# Patient Record
Sex: Female | Born: 1960 | Race: White | Hispanic: No | Smoking: Never smoker
Health system: Southern US, Community
[De-identification: ages and names within clinical notes are randomized; demographics above are authoritative.]

## PROBLEM LIST (undated history)

## (undated) DIAGNOSIS — E049 Nontoxic goiter, unspecified: Secondary | ICD-10-CM

## (undated) DIAGNOSIS — C801 Malignant (primary) neoplasm, unspecified: Secondary | ICD-10-CM

## (undated) DIAGNOSIS — E785 Hyperlipidemia, unspecified: Secondary | ICD-10-CM

## (undated) DIAGNOSIS — I10 Essential (primary) hypertension: Secondary | ICD-10-CM

## (undated) HISTORY — PX: FOOT SURGERY: SHX648

## (undated) HISTORY — DX: Malignant (primary) neoplasm, unspecified: C80.1

## (undated) HISTORY — DX: Nontoxic goiter, unspecified: E04.9

## (undated) HISTORY — DX: Hyperlipidemia, unspecified: E78.5

## (undated) HISTORY — PX: KNEE SURGERY: SHX244

## (undated) HISTORY — PX: DG C-ARM 2 VIEW RIGHT ANKLE (ARMC HX): HXRAD1458

## (undated) HISTORY — PX: ABDOMINAL HYSTERECTOMY: SHX81

## (undated) HISTORY — PX: LIPOMA RESECTION: SHX23

## (undated) HISTORY — DX: Essential (primary) hypertension: I10

## (undated) HISTORY — PX: OTHER SURGICAL HISTORY: SHX169

## (undated) HISTORY — PX: TOTAL ABDOMINAL HYSTERECTOMY: SHX209

---

## 1998-03-26 ENCOUNTER — Ambulatory Visit (HOSPITAL_COMMUNITY): Admission: RE | Admit: 1998-03-26 | Discharge: 1998-03-26 | Payer: Self-pay | Admitting: Obstetrics and Gynecology

## 1998-07-08 ENCOUNTER — Ambulatory Visit (HOSPITAL_COMMUNITY): Admission: RE | Admit: 1998-07-08 | Discharge: 1998-07-08 | Payer: Self-pay | Admitting: Orthopedic Surgery

## 1998-07-08 ENCOUNTER — Encounter: Payer: Self-pay | Admitting: Orthopedic Surgery

## 1998-08-13 ENCOUNTER — Encounter: Admission: RE | Admit: 1998-08-13 | Discharge: 1998-09-07 | Payer: Self-pay | Admitting: Orthopedic Surgery

## 1999-04-07 ENCOUNTER — Encounter: Payer: Self-pay | Admitting: Obstetrics and Gynecology

## 1999-04-07 ENCOUNTER — Ambulatory Visit (HOSPITAL_COMMUNITY): Admission: RE | Admit: 1999-04-07 | Discharge: 1999-04-07 | Payer: Self-pay | Admitting: Obstetrics and Gynecology

## 2000-05-07 ENCOUNTER — Encounter: Payer: Self-pay | Admitting: Obstetrics and Gynecology

## 2000-05-07 ENCOUNTER — Ambulatory Visit (HOSPITAL_COMMUNITY): Admission: RE | Admit: 2000-05-07 | Discharge: 2000-05-07 | Payer: Self-pay | Admitting: Obstetrics and Gynecology

## 2001-05-14 ENCOUNTER — Ambulatory Visit (HOSPITAL_COMMUNITY): Admission: RE | Admit: 2001-05-14 | Discharge: 2001-05-14 | Payer: Self-pay | Admitting: Family Medicine

## 2001-05-14 ENCOUNTER — Encounter: Payer: Self-pay | Admitting: Obstetrics and Gynecology

## 2001-08-02 ENCOUNTER — Encounter: Admission: RE | Admit: 2001-08-02 | Discharge: 2001-08-02 | Payer: Self-pay | Admitting: Family Medicine

## 2001-08-02 ENCOUNTER — Encounter: Payer: Self-pay | Admitting: Family Medicine

## 2001-08-20 ENCOUNTER — Encounter: Payer: Self-pay | Admitting: Family Medicine

## 2001-08-20 ENCOUNTER — Ambulatory Visit (HOSPITAL_COMMUNITY): Admission: RE | Admit: 2001-08-20 | Discharge: 2001-08-20 | Payer: Self-pay | Admitting: Family Medicine

## 2001-09-02 ENCOUNTER — Ambulatory Visit (HOSPITAL_COMMUNITY): Admission: RE | Admit: 2001-09-02 | Discharge: 2001-09-02 | Payer: Self-pay | Admitting: Internal Medicine

## 2001-09-02 ENCOUNTER — Encounter (INDEPENDENT_AMBULATORY_CARE_PROVIDER_SITE_OTHER): Payer: Self-pay | Admitting: Specialist

## 2001-09-02 ENCOUNTER — Encounter: Payer: Self-pay | Admitting: Internal Medicine

## 2001-09-03 ENCOUNTER — Ambulatory Visit (HOSPITAL_COMMUNITY): Admission: RE | Admit: 2001-09-03 | Discharge: 2001-09-03 | Payer: Self-pay | Admitting: Diagnostic Radiology

## 2001-09-03 ENCOUNTER — Encounter: Payer: Self-pay | Admitting: Internal Medicine

## 2001-09-11 ENCOUNTER — Ambulatory Visit (HOSPITAL_COMMUNITY): Admission: RE | Admit: 2001-09-11 | Discharge: 2001-09-11 | Payer: Self-pay | Admitting: Internal Medicine

## 2001-09-11 ENCOUNTER — Encounter: Payer: Self-pay | Admitting: Internal Medicine

## 2004-04-01 ENCOUNTER — Encounter: Payer: Self-pay | Admitting: Family Medicine

## 2004-04-01 LAB — CONVERTED CEMR LAB: Triglycerides: 51 mg/dL

## 2004-09-22 ENCOUNTER — Ambulatory Visit: Payer: Self-pay | Admitting: Internal Medicine

## 2004-10-21 ENCOUNTER — Ambulatory Visit: Payer: Self-pay | Admitting: Pulmonary Disease

## 2004-10-28 ENCOUNTER — Ambulatory Visit: Payer: Self-pay | Admitting: Internal Medicine

## 2004-11-04 ENCOUNTER — Ambulatory Visit: Payer: Self-pay | Admitting: Internal Medicine

## 2004-11-16 ENCOUNTER — Ambulatory Visit (HOSPITAL_COMMUNITY): Admission: RE | Admit: 2004-11-16 | Discharge: 2004-11-16 | Payer: Self-pay | Admitting: Internal Medicine

## 2005-01-05 ENCOUNTER — Encounter: Admission: RE | Admit: 2005-01-05 | Discharge: 2005-04-05 | Payer: Self-pay | Admitting: Family Medicine

## 2006-04-23 ENCOUNTER — Emergency Department (HOSPITAL_COMMUNITY): Admission: EM | Admit: 2006-04-23 | Discharge: 2006-04-23 | Payer: Self-pay | Admitting: Emergency Medicine

## 2006-05-02 ENCOUNTER — Ambulatory Visit: Payer: Self-pay | Admitting: Family Medicine

## 2006-05-08 DIAGNOSIS — F325 Major depressive disorder, single episode, in full remission: Secondary | ICD-10-CM | POA: Insufficient documentation

## 2006-07-06 ENCOUNTER — Encounter: Payer: Self-pay | Admitting: Family Medicine

## 2006-07-06 DIAGNOSIS — J309 Allergic rhinitis, unspecified: Secondary | ICD-10-CM | POA: Insufficient documentation

## 2006-07-06 DIAGNOSIS — G43909 Migraine, unspecified, not intractable, without status migrainosus: Secondary | ICD-10-CM | POA: Insufficient documentation

## 2006-11-20 ENCOUNTER — Encounter (INDEPENDENT_AMBULATORY_CARE_PROVIDER_SITE_OTHER): Payer: Self-pay | Admitting: Family Medicine

## 2007-01-08 ENCOUNTER — Ambulatory Visit: Payer: Self-pay | Admitting: Family Medicine

## 2007-01-08 DIAGNOSIS — J45909 Unspecified asthma, uncomplicated: Secondary | ICD-10-CM

## 2007-01-08 DIAGNOSIS — J4520 Mild intermittent asthma, uncomplicated: Secondary | ICD-10-CM | POA: Insufficient documentation

## 2007-01-08 DIAGNOSIS — I10 Essential (primary) hypertension: Secondary | ICD-10-CM | POA: Insufficient documentation

## 2007-01-09 ENCOUNTER — Encounter: Payer: Self-pay | Admitting: Family Medicine

## 2007-01-09 LAB — CONVERTED CEMR LAB
ALT: 23 units/L (ref 0–35)
CO2: 25 meq/L (ref 19–32)
Chloride: 105 meq/L (ref 96–112)
Cholesterol, target level: 200 mg/dL
Cholesterol: 213 mg/dL — ABNORMAL HIGH (ref 0–200)
LDL Goal: 160 mg/dL
Sodium: 143 meq/L (ref 135–145)
Total Bilirubin: 0.4 mg/dL (ref 0.3–1.2)
Total Protein: 7 g/dL (ref 6.0–8.3)
VLDL: 19 mg/dL (ref 0–40)

## 2007-02-19 ENCOUNTER — Ambulatory Visit: Payer: Self-pay | Admitting: Family Medicine

## 2007-03-28 ENCOUNTER — Ambulatory Visit: Payer: Self-pay | Admitting: Family Medicine

## 2007-05-27 ENCOUNTER — Ambulatory Visit: Payer: Self-pay | Admitting: Family Medicine

## 2007-05-27 DIAGNOSIS — D239 Other benign neoplasm of skin, unspecified: Secondary | ICD-10-CM | POA: Insufficient documentation

## 2007-06-22 ENCOUNTER — Encounter: Payer: Self-pay | Admitting: Family Medicine

## 2007-09-12 ENCOUNTER — Telehealth: Payer: Self-pay | Admitting: Family Medicine

## 2007-09-26 ENCOUNTER — Ambulatory Visit: Payer: Self-pay | Admitting: Family Medicine

## 2007-09-26 DIAGNOSIS — R059 Cough, unspecified: Secondary | ICD-10-CM | POA: Insufficient documentation

## 2007-09-26 DIAGNOSIS — R05 Cough: Secondary | ICD-10-CM

## 2007-10-10 ENCOUNTER — Telehealth: Payer: Self-pay | Admitting: Family Medicine

## 2007-11-19 ENCOUNTER — Encounter: Payer: Self-pay | Admitting: Family Medicine

## 2008-01-27 ENCOUNTER — Telehealth: Payer: Self-pay | Admitting: Family Medicine

## 2008-02-03 ENCOUNTER — Ambulatory Visit: Payer: Self-pay | Admitting: Family Medicine

## 2008-02-03 DIAGNOSIS — K449 Diaphragmatic hernia without obstruction or gangrene: Secondary | ICD-10-CM | POA: Insufficient documentation

## 2008-02-10 ENCOUNTER — Telehealth: Payer: Self-pay | Admitting: Family Medicine

## 2008-06-02 ENCOUNTER — Ambulatory Visit: Payer: Self-pay | Admitting: Family Medicine

## 2008-06-02 DIAGNOSIS — M79609 Pain in unspecified limb: Secondary | ICD-10-CM | POA: Insufficient documentation

## 2008-06-02 DIAGNOSIS — N951 Menopausal and female climacteric states: Secondary | ICD-10-CM | POA: Insufficient documentation

## 2008-06-02 LAB — CONVERTED CEMR LAB: Blood Glucose, Fasting: 96 mg/dL

## 2008-09-24 ENCOUNTER — Encounter: Payer: Self-pay | Admitting: Family Medicine

## 2008-10-26 ENCOUNTER — Ambulatory Visit: Payer: Self-pay | Admitting: Family Medicine

## 2008-10-26 DIAGNOSIS — M25519 Pain in unspecified shoulder: Secondary | ICD-10-CM | POA: Insufficient documentation

## 2008-11-11 ENCOUNTER — Ambulatory Visit: Payer: Self-pay | Admitting: Family Medicine

## 2008-11-17 ENCOUNTER — Encounter: Payer: Self-pay | Admitting: Family Medicine

## 2009-07-31 LAB — HM MAMMOGRAPHY: HM Mammogram: NORMAL

## 2009-08-10 ENCOUNTER — Encounter: Payer: Self-pay | Admitting: Family Medicine

## 2009-09-29 ENCOUNTER — Ambulatory Visit: Payer: Self-pay | Admitting: Family Medicine

## 2009-09-29 DIAGNOSIS — Z780 Asymptomatic menopausal state: Secondary | ICD-10-CM | POA: Insufficient documentation

## 2009-09-30 LAB — CONVERTED CEMR LAB
ALT: 18 units/L (ref 0–35)
CO2: 25 meq/L (ref 19–32)
Calcium: 9.4 mg/dL (ref 8.4–10.5)
Chloride: 105 meq/L (ref 96–112)
Cholesterol: 223 mg/dL — ABNORMAL HIGH (ref 0–200)
Sodium: 141 meq/L (ref 135–145)
Total Protein: 6.7 g/dL (ref 6.0–8.3)

## 2009-11-23 ENCOUNTER — Encounter: Payer: Self-pay | Admitting: Family Medicine

## 2010-05-24 ENCOUNTER — Telehealth: Payer: Self-pay | Admitting: Family Medicine

## 2010-05-24 ENCOUNTER — Encounter (INDEPENDENT_AMBULATORY_CARE_PROVIDER_SITE_OTHER): Payer: Self-pay | Admitting: *Deleted

## 2010-05-31 ENCOUNTER — Ambulatory Visit: Payer: Self-pay | Admitting: Emergency Medicine

## 2010-05-31 DIAGNOSIS — IMO0002 Reserved for concepts with insufficient information to code with codable children: Secondary | ICD-10-CM | POA: Insufficient documentation

## 2010-05-31 DIAGNOSIS — S0010XA Contusion of unspecified eyelid and periocular area, initial encounter: Secondary | ICD-10-CM | POA: Insufficient documentation

## 2010-05-31 DIAGNOSIS — I10 Essential (primary) hypertension: Secondary | ICD-10-CM | POA: Insufficient documentation

## 2010-05-31 DIAGNOSIS — M25579 Pain in unspecified ankle and joints of unspecified foot: Secondary | ICD-10-CM | POA: Insufficient documentation

## 2010-05-31 DIAGNOSIS — E785 Hyperlipidemia, unspecified: Secondary | ICD-10-CM | POA: Insufficient documentation

## 2010-06-01 ENCOUNTER — Encounter: Payer: Self-pay | Admitting: Family Medicine

## 2010-06-02 ENCOUNTER — Encounter: Payer: Self-pay | Admitting: Family Medicine

## 2010-06-03 LAB — CONVERTED CEMR LAB
Albumin ELP: 60.2 % (ref 55.8–66.1)
Alpha-2-Globulin: 11.6 % (ref 7.1–11.8)
Beta Globulin: 6.5 % (ref 4.7–7.2)
Total Protein, Serum Electrophoresis: 7 g/dL (ref 6.0–8.3)

## 2010-06-06 ENCOUNTER — Encounter: Payer: Self-pay | Admitting: Family Medicine

## 2010-06-07 ENCOUNTER — Telehealth (INDEPENDENT_AMBULATORY_CARE_PROVIDER_SITE_OTHER): Payer: Self-pay

## 2010-06-08 ENCOUNTER — Telehealth: Payer: Self-pay | Admitting: Family Medicine

## 2010-07-13 ENCOUNTER — Encounter: Payer: Self-pay | Admitting: Family Medicine

## 2010-07-13 ENCOUNTER — Ambulatory Visit: Payer: Self-pay | Admitting: Family Medicine

## 2010-07-13 DIAGNOSIS — J069 Acute upper respiratory infection, unspecified: Secondary | ICD-10-CM | POA: Insufficient documentation

## 2010-07-26 ENCOUNTER — Encounter: Payer: Self-pay | Admitting: Family Medicine

## 2010-08-01 ENCOUNTER — Encounter: Payer: Self-pay | Admitting: Family Medicine

## 2010-09-01 NOTE — Progress Notes (Signed)
Summary: Bone scan results.   Phone Note Outgoing Call   Summary of Call: Call pt: Bone scan was normal, except for injury area in teh ankle.  Initial call taken by: Nani Gasser MD,  June 08, 2010 1:27 PM  Follow-up for Phone Call        Pt notified. Follow-up by: Kathlene November LPN,  June 08, 2010 1:55 PM

## 2010-09-01 NOTE — Letter (Signed)
Summary: Letter with Colonoscopy Results/[facility]  Letter with Colonoscopy Results/[facility]   Imported By: Lanelle Bal 08/04/2010 11:04:21  _____________________________________________________________________  External Attachment:    Type:   Image     Comment:   External Document

## 2010-09-01 NOTE — Letter (Signed)
Summary: Letter with Path Results/[facility]  Letter with Path Results/[facility]   Imported By: Lanelle Bal 08/10/2010 08:41:45  _____________________________________________________________________  External Attachment:    Type:   Image     Comment:   External Document

## 2010-09-01 NOTE — Letter (Signed)
Summary: Out of Work  [facility]  [address]   [place], [place] [address]   Phone: [phone]  Fax: [phone]    May 31, 2010   Employee:  GUDELIA EUGENE    To Whom It May Concern:   For Medical reasons, please excuse the above named employee from work for the following dates:  Start:   05/31/10  End:   06/02/10  If you need additional information, please feel free to contact our office.         Sincerely,    Hoyt Koch MD

## 2010-09-01 NOTE — Letter (Signed)
Summary: [facility]  [facility]   Imported By: Lanelle Bal 12/06/2009 08:07:25  _____________________________________________________________________  External Attachment:    Type:   Image     Comment:   External Document

## 2010-09-01 NOTE — Letter (Signed)
Summary: Primary Care Consult Scheduled Letter  [facility]  [address]   [place], [place] [address]   Phone: [phone]  Fax: [phone]      05/24/2010 MRN: [id number]  Sara Callahan [address] [place], [place]  [address]    Dear Ms. Encina,    We have scheduled an appointment for you.  At the recommendation of Dr.Metheney, we have scheduled you a consult with [facility] on 05/31/10 at 10:00 with the nurse,and on 06/07/10 at 8:30 with Dr. Yevonne Pax for a colonoscopy. Their address is [address], [address] [place]. [phone]. The office phone number is [phone].  If this appointment day and time is not convenient for you, please feel free to call the office of the doctor you are being referred to at the number listed above and reschedule the appointment.     It is important for you to keep your scheduled appointments. We are here to make sure you are given good patient care.    Thank you, Victorino Dike [phone] Patient Care Coordinator [facility]

## 2010-09-01 NOTE — Assessment & Plan Note (Signed)
Summary: RIGHT FOOT INJURY AND LEFT EYE PAIN   Vital Signs:  Patient Profile:   50 Years Old Female CC:      Stepped in a hole and fell face down in the road Rt foot and ankle/Left eye pain Height:     62 inches Weight:      147 pounds O2 Sat:      96 % O2 treatment:    Room Air Temp:     98.8 degrees F oral Pulse rate:   81 / minute Pulse rhythm:   regular Resp:     16 per minute BP sitting:   122 / 86  (left arm) Cuff size:   regular  Pt. in pain?   yes    Location:   foot    Intensity:   7    Type:       Throbbing  Vitals Entered By: Emilio Math (May 31, 2010 11:16 AM)               Vision Screening: Left eye with correction: 20 / 20 Right eye with correction: 20 / 20 Both eyes with correction: 20 / 20  Color vision testing: normal      Vision Entered By: Emilio Math (May 31, 2010 11:24 AM)    Current Allergies (reviewed today): ! SULFA ! NOVOCAIN ! LISINOPRIL DARVOCETHistory of Present Illness History from: patient & husband Chief Complaint: Stepped in a hole and fell face down in the road Rt foot and ankle/Left eye pain History of Present Illness: 1) She was out Trick-or-treating last night, stepped in a hole, tripped and fell.  Now with constant throbbing pain throughout her ankle, swelling, bruising.  She was unable to get up and walk afterwards, but has put some weight on her heel.  Most of the pain she feels is in the front.  Hasn't used any OTC meds or modalities  2) When she fell, she also hit her face on the ground and has a L black eye.  Mild discomfort, soreness.  No visual problems.  Current Meds CYMBALTA 60 MG CPEP (DULOXETINE HCL) Take 1 tablet by mouth once a day NASONEX 50 MCG/ACT SUSP (MOMETASONE FUROATE) As needed for allergies XOPENEX HFA 45 MCG/ACT AERO (LEVALBUTEROL TARTRATE) 2 puff INH every 2-4 hours as needed SOB CYMBALTA 60 MG CPEP (DULOXETINE HCL)   REVIEW OF SYSTEMS Constitutional Symptoms      Denies fever,  chills, night sweats, weight loss, weight gain, and fatigue.  Eyes       Complains of eye pain.      Denies change in vision, eye discharge, glasses, contact lenses, and eye surgery. Ear/Nose/Throat/Mouth       Denies hearing loss/aids, change in hearing, ear pain, ear discharge, dizziness, frequent runny nose, frequent nose bleeds, sinus problems, sore throat, hoarseness, and tooth pain or bleeding.  Respiratory       Denies dry cough, productive cough, wheezing, shortness of breath, asthma, bronchitis, and emphysema/COPD.  Cardiovascular       Denies murmurs, chest pain, and tires easily with exhertion.    Gastrointestinal       Denies stomach pain, nausea/vomiting, diarrhea, constipation, blood in bowel movements, and indigestion. Genitourniary       Denies painful urination, kidney stones, and loss of urinary control. Neurological       Denies paralysis, seizures, and fainting/blackouts. Musculoskeletal       Complains of muscle pain, joint pain, joint stiffness, decreased range of motion, redness,  and swelling.      Denies muscle weakness and gout.  Skin       Complains of bruising.      Denies unusual mles/lumps or sores and hair/skin or nail changes.  Psych       Denies mood changes, temper/anger issues, anxiety/stress, speech problems, depression, and sleep problems.  Past History:  Past Medical History: Oncologist-Dr. Hulda Marin, Spinal Cell tumor left lung. Hyperlipidemia Hypertension  Past Surgical History: Reviewed history from 10/26/2008 and no changes required. c/sec  1991and 1994,  hysterectomy  01/1997, Left lung spindle tumor removed Knee surgery 02/2001-Left left foot surgery 2010  Family History: Reviewed history from 09/26/2007 and no changes required. cousins with depression, grandparents with DM, MI, HTN Mother with palpitations Mother with osteoporosis Father died after esophageal rupture, was having difficulty eating.   Social History: Reviewed  history from 05/08/2006 and no changes required. 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  Married to [place] with 3 kids.  Lives with husband Onalee Hua, son Ivin Booty, and dtr [place].  Never smoked, no etOH, no drugs. Physical Exam General appearance: well developed, well nourished, mild distress Head: normocephalic,ecchymoses around L eye Eyes: PERRLA EOMI, no orbital edema Ears: normal, no lesions or deformities Nasal: mucosa pink, nonedematous, no septal deviation, turbinates normal Oral/Pharynx: tongue normal, posterior pharynx without erythema or exudate Heart: normal pulses DP/PT and normal cap refill Extremities: L knee abrasion Neurological: normal sensation Skin: see below MSE: oriented to time, place, and person R ankle: ROM & strength limited by pain, resisted motions are painful.  +TTP throughout R ankle including medial/lateral malleolus, prox fibula.  Mild TTP at navicular.  No TTP base of 5th, calcaneus, Achilles.  + effusion lateral and anterior ankle.  Mild ecchymoses.  Assessment New Problems: ABRASION, KNEE, LEFT (ICD-916.0) BLACK EYE, NOT OTHERWISE SPECIFIED (ICD-921.0) ANKLE PAIN, RIGHT (ICD-719.47) HYPERTENSION (ICD-401.9) HYPERLIPIDEMIA (ICD-272.4)  R ankle sprain Xray is normal  Patient Education: Patient and/or caregiver instructed in the following: rest, fluids, Tylenol prn, Ibuprofen prn.  Plan New Orders: New Patient Level IV [99204] T-DG Ankle Complete*R* [73610] T-DG Tibia/Fibula*R* [73590] Aircast Ankle Brace [L4350] Planning Comments:   Ice, compression, elevation, Aircast ankle brace Refer to sports med if not improving in 1-2 weeks +/- PT For face, use frequent ice.  If not improving in a few days, f/u with your PCP.  Patient requested no facial Xray today. A small area was found in your R tibia that radiology found concerning for myeloma.  I spoke to Dr. Linford Arnold.  The patient will follow up with her about scheduling follow up scans  or referrals.      The patient and/or caregiver has been counseled thoroughly with regard to medications prescribed including dosage, schedule, interactions, rationale for use, and possible side effects and they verbalize understanding.  Diagnoses and expected course of recovery discussed and will return if not improved as expected or if the condition worsens. Patient and/or caregiver verbalized understanding.   Orders Added: 1)  New Patient Level IV [99204] 2)  T-DG Ankle Complete*R* [73610] 3)  T-DG Tibia/Fibula*R* [73590] 4)  Aircast Ankle Brace [L4350]

## 2010-09-01 NOTE — Assessment & Plan Note (Signed)
Summary: CPE   Vital Signs:  Patient profile:   50 year old female Height:      62 inches Weight:      156 pounds Pulse rate:   100 / minute BP sitting:   134 / 85  (left arm) Cuff size:   regular  Vitals Entered By: Kathlene November (September 29, 2009 8:23 AM) CC: CPE no pap   Primary Care Provider:  Linford Arnold, C  CC:  CPE no pap.  History of Present Illness: Feels having somemenopuase sxs. Still haver her ovaries. Has had hot flahses for about 3 years. She is hintrested in HRT.  Feels she is also having memory problems.   Current Medications (verified): 1)  Cymbalta 60 Mg Cpep (Duloxetine Hcl) .... Take 1 Tablet By Mouth Once A Day 2)  Nasonex 50 Mcg/act Susp (Mometasone Furoate) .... As Needed For Allergies 3)  Xopenex Hfa 45 Mcg/act Aero (Levalbuterol Tartrate) .... 2 Puff Inh Every 2-4 Hours As Needed Sob 4)  Cozaar 50 Mg Tabs (Losartan Potassium) .... Take 1 Tablet By Mouth Once A Day  Allergies (verified): 1)  ! Sulfa 2)  ! Novocain 3)  ! Lisinopril 4)  Darvocet  Comments:  Nurse/Medical Assistant: The patient's medications and allergies were reviewed with the patient and were updated in the Medication and Allergy Lists. Kathlene November (September 29, 2009 8:24 AM)  Past History:  Past Medical History: Last updated: 05/08/2006 Oncologist-Dr. Hulda Marin, Spinal Cell tumor left lung.  Past Surgical History: Last updated: 10/26/2008 c/sec  1991and 1994,  hysterectomy  01/1997, Left lung spindle tumor removed Knee surgery 02/2001-Left left foot surgery 2010  Family History: Last updated: 09/26/2007 cousins with depression, grandparents with DM, MI, HTN Mother with palpitations Mother with osteoporosis Father died after esophageal rupture, was having difficulty eating.   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  Married to [place] with 3 kids.  Lives with husband Onalee Hua, son Ivin Booty, and dtr [place].  Never smoked,  no etOH, no drugs.  Review of Systems  The patient denies anorexia, fever, weight loss, weight gain, vision loss, decreased hearing, hoarseness, chest pain, syncope, dyspnea on exertion, peripheral edema, prolonged cough, headaches, hemoptysis, abdominal pain, melena, hematochezia, severe indigestion/heartburn, hematuria, incontinence, genital sores, muscle weakness, suspicious skin lesions, transient blindness, difficulty walking, depression, unusual weight change, abnormal bleeding, enlarged lymph nodes, and breast masses.    Physical Exam  General:  Well-developed,well-nourished,in no acute distress; alert,appropriate and cooperative throughout examination Head:  Normocephalic and atraumatic without obvious abnormalities. No apparent alopecia or balding. Eyes:  No corneal or conjunctival inflammation noted. EOMI. Perrla.  Ears:  External ear exam shows no significant lesions or deformities.  Otoscopic examination reveals clear canals, tympanic membranes are intact bilaterally without bulging, retraction, inflammation or discharge. Hearing is grossly normal bilaterally. Nose:  External nasal examination shows no deformity or inflammation. Nasal mucosa are pink and moist without lesions or exudates. Mouth:  Oral mucosa and oropharynx without lesions or exudates.  Teeth in good repair. Neck:  No deformities, masses, or tenderness noted. Chest Wall:  No deformities, masses, or tenderness noted. Breasts:  No mass, nodules, thickening, tenderness, bulging, retraction, inflamation, nipple discharge or skin changes noted.   Lungs:  Normal respiratory effort, chest expands symmetrically. Lungs are clear to auscultation, no crackles or wheezes. Heart:  Normal rate and regular rhythm. S1 and S2 normal without gallop, murmur, click, rub or other extra sounds. Abdomen:  Bowel  sounds positive,abdomen soft and non-tender without masses, organomegaly or hernias noted. Msk:  No deformity or scoliosis noted of  thoracic or lumbar spine.   Pulses:  R and L carotid,radial,dorsalis pedis and posterior tibial pulses are full and equal bilaterally Extremities:  No clubbing, cyanosis, edema, or deformity noted with normal full range of motion of all joints.   Neurologic:  No cranial nerve deficits noted. Station and gait are normal. DTRs are symmetrical throughout. Sensory, motor and coordinative functions appear intact. Skin:  no rashes.   Cervical Nodes:  No lymphadenopathy noted Axillary Nodes:  No palpable lymphadenopathy Psych:  Cognition and judgment appear intact. Alert and cooperative with normal attention span and concentration. No apparent delusions, illusions, hallucinations   Impression & Recommendations:  Problem # 1:  EXAMINATION, ROUTINE MEDICAL (ICD-V70.0) Will check paper chart to see if due for Tdap.  Due for screening labs.  recommend dec caffeine intake.  See pt H.O.  Dsicussed guidleines for screening for colon Ca> her father passed away after an esophageal tear during endocopy so she is fearful of colonoscopy. she wants to think aobut her options and let me know. Orders: T-Comprehensive Metabolic Panel [phone]) T-Lipid Profile [phone]) T-TSH [phone])  Problem # 2:  POSTMENOPAUSAL STATUS (ICD-V49.81) She is interested in HRT.  Discussed options including bioidenticals. Gave her the phone number for [facility]. They can do saliva testing.   Complete Medication List: 1)  Cymbalta 60 Mg Cpep (Duloxetine hcl) .... Take 1 tablet by mouth once a day 2)  Nasonex 50 Mcg/act Susp (Mometasone furoate) .... As needed for allergies 3)  Xopenex Hfa 45 Mcg/act Aero (Levalbuterol tartrate) .... 2 puff inh every 2-4 hours as needed sob 4)  Cozaar 50 Mg Tabs (Losartan potassium) .... Take 1 tablet by mouth once a day  Other Orders: Tdap => 101yrs IM (02542) Admin 1st Vaccine (70623)  Patient Instructions: 1)  Can call [facility] to ask for a counsult for bioidentical  hormones.  [phone].  Located in [place].  2)  It is important that you exercise reguarly at least 20 minutes 5 times a week. If you develop chest pain, have severe difficulty breathing, or feel very tired, stop exercising immediately and seek medical attention.  3)  Take calcium +vitamin D daily.   PAP Next Due:  Not Indicated Mammogram Result Date:  07/31/2009 Mammogram Result:  normal Mammogram Next Due:  1 yr     Immunizations Administered:  Tetanus Vaccine:    Vaccine Type: Tdap    Site: right deltoid    Mfr: GlaxoSmithKline    Dose: 0.5 ml    Route: IM    Given by: Kathlene November    Exp. Date: 07/26/2010    Lot #: [id number]    VIS given: 06/18/07 version given September 29, 2009.

## 2010-09-01 NOTE — Progress Notes (Signed)
Summary: Courtesy message  Phone Note Outgoing Call   Call placed by: Areta Haber CMA,  June 07, 2010 9:41 AM Call placed to: Patient Summary of Call: Courtesy call  to pt  - Courtesy mess LOVM of hm number. Initial call taken by: Areta Haber CMA,  June 07, 2010 9:43 AM

## 2010-09-01 NOTE — Progress Notes (Signed)
Summary: REfer for colon  ---- Converted from flag ---- ---- 05/23/2010 1:03 PM, Nani Gasser MD wrote: Call pt: Needs to do stool cards since she doesn't want colonoscopy Can come by and pick up cards. ------------------------------  05/24/10 8:29 called pt and pt states she would just rather do the colonoscopy.acm

## 2010-09-01 NOTE — Medication Information (Signed)
Summary: Possible Nonadherence with Losartan Potassium/[facility]  Possible Nonadherence with Losartan Potassium/[facility]   Imported By: Lanelle Bal 08/19/2009 11:01:28  _____________________________________________________________________  External Attachment:    Type:   Image     Comment:   External Document

## 2010-09-01 NOTE — Assessment & Plan Note (Signed)
Summary: SInus press, runny nose/cough-greenish, R ear pain x 10dys rm 3   Vital Signs:  Patient Profile:   50 Years Old Female CC:      Cold & URI symptoms Height:     62 inches Weight:      151 pounds O2 Sat:      99 % O2 treatment:    Room Air Temp:     98.3 degrees F oral Pulse rate:   85 / minute Pulse rhythm:   regular Resp:     16 per minute BP sitting:   144 / 94  (left arm) Cuff size:   regular  Vitals Entered By: Areta Haber CMA (July 13, 2010 4:34 PM)                  Current Allergies: ! SULFA ! NOVOCAIN ! LISINOPRIL ! * DECONGESTANTS DARVOCET     History of Present Illness Chief Complaint: Cold & URI symptoms History of Present Illness:  Subjective: Patient complains of URI symptoms that started 10 days ago with a sore throat and nasal congestion.  Sore throat now resolved.  Past history of pneumonia.  + cough started two days ago. No pleuritic pain No wheezing + post-nasal drainage ? 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) COUGH, CHRONIC (ICD-786.2) MOLE (ICD-216.9) HYPERTENSION, BENIGN ESSENTIAL (ICD-401.1) EXAMINATION, ROUTINE MEDICAL (ICD-V70.0) ASTHMA (ICD-493.90) RHINITIS, ALLERGIC NOS (ICD-477.9) MIGRAINE HEADACHE (ICD-346.90) DEPRESSIVE DISORDER, NOS (ICD-311)   Current Meds CYMBALTA 60 MG CPEP (DULOXETINE HCL) Take 1 tablet by mouth once a  day NASONEX 50 MCG/ACT SUSP (MOMETASONE FUROATE) As needed for allergies XOPENEX HFA 45 MCG/ACT AERO (LEVALBUTEROL TARTRATE) 2 puff INH every 2-4 hours as needed SOB CYMBALTA 60 MG CPEP (DULOXETINE HCL)  BENZONATATE 200 MG CAPS (BENZONATATE) One by mouth hs as needed cough AMOXICILLIN 875 MG TABS (AMOXICILLIN) One by mouth two times a day (Rx void after 07/21/10)  REVIEW OF SYSTEMS Constitutional Symptoms      Denies fever, chills, night sweats, weight loss, weight gain, and fatigue.  Eyes       Complains of eye pain and eye drainage.      Denies change in vision, glasses, contact lenses, and eye surgery. Ear/Nose/Throat/Mouth       Complains of ear pain, frequent runny nose, and sinus problems.      Denies hearing loss/aids, change in hearing, ear discharge, dizziness, frequent nose bleeds, sore throat, hoarseness, and tooth pain or bleeding.      Comments: R,greenish x 10 dys Respiratory       Complains of productive cough.      Denies dry cough, wheezing, shortness of breath, asthma, bronchitis, and emphysema/COPD.  Cardiovascular       Denies murmurs, chest pain, and tires easily with exhertion.    Gastrointestinal       Denies stomach pain, nausea/vomiting, diarrhea, constipation, blood in bowel movements, and indigestion. Genitourniary       Denies painful urination, kidney stones, and loss of urinary control. Neurological       Complains of headaches.  Denies paralysis, seizures, and fainting/blackouts. Musculoskeletal       Denies muscle pain, joint pain, joint stiffness, decreased range of motion, redness, swelling, muscle weakness, and gout.  Skin       Denies bruising, unusual mles/lumps or sores, and hair/skin or nail changes.  Psych       Denies mood changes, temper/anger issues, anxiety/stress, speech problems, depression, and sleep problems. Other Comments: Greenish. Pt has not seen her PCP for this.   Past History:  Past Medical History: Last updated:  05/31/2010 Oncologist-Dr. Hulda Marin, Spinal Cell tumor left lung. Hyperlipidemia Hypertension  Past Surgical History: Last updated: 10/26/2008 c/sec  1991and 1994,  hysterectomy  01/1997, Left lung spindle tumor removed Knee surgery 02/2001-Left left foot surgery 2010  Family History: Last updated: 09/26/2007 cousins with depression, grandparents with DM, MI, HTN Mother with palpitations Mother with osteoporosis Father died after esophageal rupture, was having difficulty eating.   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  Married to [place] with 3 kids.  Lives with husband Onalee Hua, son Ivin Booty, and dtr [place].  Never smoked, no etOH, no drugs.  Risk Factors: Caffeine Use: 5+ (01/08/2007) Exercise: no (01/08/2007)  Risk Factors: Smoking Status: never (01/08/2007)   Objective:  No acute distress  Eyes:  Pupils are equal, round, and reactive to light and accomdation.  Extraocular movement is intact.  Conjunctivae are not inflamed.  Ears:  Canals normal.  Tympanic membranes normal.   Nose:  Normal septum.  Normal turbinates, mildly congested.   No sinus tenderness present.  Pharynx:  Normal  Neck:  Supple.  No adenopathy is present.  Lungs:  Clear to auscultation.  Breath sounds are equal.  Heart:  Regular rate and rhythm without murmurs, rubs, or gallops.  Abdomen:  Nontender without masses or hepatosplenomegaly.  Bowel sounds are present.  No CVA or flank tenderness.  Extremities:  No edema.   Skin:  No rash Assessment New Problems: UPPER RESPIRATORY INFECTION, ACUTE (ICD-465.9)  NO EVIDENCE BACTERIAL INFECTION TODAY  Plan New Medications/Changes: AMOXICILLIN 875 MG TABS (AMOXICILLIN) One by mouth two times a day (Rx void after 07/21/10)  #20 x 0, 07/13/2010, Donna Christen MD BENZONATATE 200 MG CAPS (BENZONATATE) One by mouth hs as needed cough  #12 x 0, 07/13/2010, Donna Christen MD  New Orders: Pulse Oximetry  (single measurment) [94760] Est. Patient Level III [01027] Planning Comments:   Treat symptomatically for now:  Increase fluid intake, begin expectorant/decongestant, cough suppressant at bedtime.  If fever/chills/sweats persist, or if not improving 5 days begin amoxicillin (given Rx to hold).  Followup with PCP if not improving 10 to 14 days.   The patient and/or caregiver has been counseled thoroughly with regard to medications prescribed including dosage, schedule, interactions, rationale for use, and possible side effects and they verbalize understanding.  Diagnoses and expected course of recovery discussed and will return if not improved as expected or if the condition worsens. Patient and/or caregiver verbalized understanding.  Prescriptions: AMOXICILLIN 875 MG TABS (AMOXICILLIN) One by mouth two times a day (Rx void after 07/21/10)  #20 x 0   Entered and Authorized by:   Donna Christen MD   Signed by:   Donna Christen MD on 07/13/2010   Method used:   Print then Give to Patient   RxID:   [id number] BENZONATATE 200 MG CAPS (BENZONATATE) One by mouth hs as needed cough  #12 x 0   Entered and Authorized by:   Jeannett Senior  Beese MD   Signed by:   Donna Christen MD on 07/13/2010   Method used:   Print then Give to Patient   RxID:   [id number]   Patient Instructions: 1)  May use Mucinex (guaifenesin) twice daily for congestion. 2)  Increase fluid intake, rest. 3)  May use Afrin nasal spray (or generic oxymetazoline) twice daily for about 5 days.  Also recommend using saline nasal spray several times daily and/or saline nasal irrigation. 4)  Begin amoxicillin if not improving about 5 days or if peristent fever develops. 5)  Followup with family doctor if not improving 10 to 14 days.  Orders Added: 1)  Pulse Oximetry (single measurment) [94760] 2)  Est. Patient Level III [14782]

## 2010-09-28 ENCOUNTER — Telehealth: Payer: Self-pay | Admitting: Family Medicine

## 2010-10-04 ENCOUNTER — Telehealth: Payer: Self-pay | Admitting: Family Medicine

## 2010-10-06 NOTE — Progress Notes (Signed)
  Phone Note Refill Request Message from:  Fax from Pharmacy on September 28, 2010 11:31 AM  Refills Requested: Medication #1:  NASONEX 50 MCG/ACT SUSP As needed for allergies Initial call taken by: Avon Gully CMA, Duncan Dull),  September 28, 2010 11:32 AM    Prescriptions: NASONEX 50 MCG/ACT SUSP (MOMETASONE FUROATE) As needed for allergies  #1 x 2   Entered by:   Avon Gully CMA, (AAMA)   Authorized by:   Nani Gasser MD   Signed by:   Avon Gully CMA, (AAMA) on 09/28/2010   Method used:   Electronically to        [facility] [phone]* (retail)       [address] [place], [place]  [address]       Ph: [phone]       Fax: [phone]   RxID:   [id number]

## 2010-10-06 NOTE — Progress Notes (Signed)
  Phone Note Refill Request Message from:  Fax from Pharmacy on September 28, 2010 10:05 AM  Refills Requested: Medication #1:  CYMBALTA 60 MG CPEP Take 1 tablet by mouth once a day Initial call taken by: Avon Gully CMA, Duncan Dull),  September 28, 2010 10:05 AM    Prescriptions: CYMBALTA 60 MG CPEP (DULOXETINE HCL) Take 1 tablet by mouth once a day  #30 Not Speci x 0   Entered by:   Avon Gully CMA, (AAMA)   Authorized by:   Nani Gasser MD   Signed by:   Avon Gully CMA, (AAMA) on 09/28/2010   Method used:   Electronically to        [facility] [phone]* (retail)       [address] [place], [place]  [address]       Ph: [phone]       Fax: [phone]   RxID:   [id number]

## 2010-10-11 NOTE — Progress Notes (Signed)
Summary: meds  Phone Note Call from Patient Call back at [phone]   Caller: Patient Call For: Nani Gasser MD Summary of Call: pt called and states the insurance will not cover the nasonex. Is there anything eles that can be called in Initial call taken by: Avon Gully CMA, Duncan Dull),  October 04, 2010 2:13 PM  Follow-up for Phone Call        Will cal in generic flonase.  Follow-up by: Nani Gasser MD,  October 04, 2010 4:25 PM  Additional Follow-up for Phone Call Additional follow up Details #1::        pt notified Additional Follow-up by: Avon Gully CMA, Duncan Dull),  October 04, 2010 4:31 PM    New/Updated Medications: FLUTICASONE PROPIONATE 50 MCG/ACT SUSP (FLUTICASONE PROPIONATE) 2 sprays on each nostril daily Prescriptions: FLUTICASONE PROPIONATE 50 MCG/ACT SUSP (FLUTICASONE PROPIONATE) 2 sprays on each nostril daily  #1 x 1   Entered and Authorized by:   Nani Gasser MD   Signed by:   Nani Gasser MD on 10/04/2010   Method used:   Electronically to        [facility] [phone]* (retail)       [address]       [place], [place]  [address]       Ph: [phone]       Fax: [phone]   RxID:   [id number]

## 2010-11-08 ENCOUNTER — Ambulatory Visit (INDEPENDENT_AMBULATORY_CARE_PROVIDER_SITE_OTHER): Payer: 59 | Admitting: Family Medicine

## 2010-11-08 VITALS — BP 130/87 | HR 103 | Ht 62.0 in | Wt 151.0 lb

## 2010-11-08 DIAGNOSIS — M79643 Pain in unspecified hand: Secondary | ICD-10-CM

## 2010-11-08 DIAGNOSIS — G629 Polyneuropathy, unspecified: Secondary | ICD-10-CM

## 2010-11-08 DIAGNOSIS — G589 Mononeuropathy, unspecified: Secondary | ICD-10-CM

## 2010-11-08 DIAGNOSIS — M79609 Pain in unspecified limb: Secondary | ICD-10-CM

## 2010-11-08 DIAGNOSIS — M79673 Pain in unspecified foot: Secondary | ICD-10-CM

## 2010-11-08 LAB — CBC
Hemoglobin: 12.8 g/dL (ref 12.0–15.0)
MCH: 28.5 pg (ref 26.0–34.0)
MCV: 85.7 fL (ref 78.0–100.0)
Platelets: 342 10*3/uL (ref 150–400)
RBC: 4.49 MIL/uL (ref 3.87–5.11)
WBC: 6.5 10*3/uL (ref 4.0–10.5)

## 2010-11-08 NOTE — Progress Notes (Signed)
  Subjective:    Patient ID: Sara Callahan, female    DOB: [birth year], 50 y.o.   MRN: [id number]  HPI Neeld sensation on the last 4th digits on the left foot. Right foot is nl.  Similar sensation in the right thumb and first finger. Started in both locations about 1 months ago. No stiffness or swelling. She is left handed.No hx of anemia.  No numbness.  Also getting some spasms in her left foot. Also arch pain on that side that feels like a muscle spasm. She says she rarely has to grab her toes and pulled them back when this happens.Lynett Grimes her up at night. No alleviating or exacerbating symptoms. Note she has had bunion surgery on her left foot. She says that her speech or so different that check she has to buy 2 different pairs of shoes that she can wear one side on each foot. She has had insoles made by a podiatrist years ago which is a family that her tissues. She does wear them when she works at [employer] She has TO WEAR dress shoes to work.    Review of Systems     Objective:   Physical Exam  Constitutional: She appears well-developed and well-nourished.  Musculoskeletal:       Right hand and thumb and first finger with normal range of motion. Strength is 5 out of 5 in the fingers. Radial pulses 2+. She does have some tenderness at the base of the thumb.  Right foot and ankle has a normal range of motion. She is able to wiggle her toes normally. She does have some tenderness over the second third and fourth metatarsals. No numbness. No bruising swelling or skin lesions. She does have a flattened possible arch on this foot. She has a very high arch on her right foot.          Assessment & Plan:  Foot pain-unclear etiology at this point. She could have a neuroma. I do not think she has a fracture and she has no sustained no injury. It is odd that the pain in her hand started at the same time. We will certainly check for B12 deficiency and thyroid disorders to make sure there is  underlying cause for having neuropathic pathic-type pain in the hand and feet starting at approximately the same time. There are suspect that these 2 complaints are separate issues. I discussed that I be happy to refer her to podiatry or possibly sports medicine. She may evaluation from her regimen she may actually benefit from inserts in her shoes as well, that she can actually run a daily basis in her dress shoes.  Hand pain-she may have a tendinitis. We discussed resting the area and possibly taking an anti-inflammatory. We could also produce it. She decided to wait until the lab results come back and possibly see sports medicine for further evaluation of her thumb. I see no evidence of actual joint inflammation or swelling to suggest arthritis.

## 2010-11-08 NOTE — Patient Instructions (Signed)
WE will call you with the lab results.

## 2010-11-09 ENCOUNTER — Encounter: Payer: Self-pay | Admitting: Family Medicine

## 2010-11-09 ENCOUNTER — Telehealth: Payer: Self-pay | Admitting: Family Medicine

## 2010-11-09 DIAGNOSIS — M79673 Pain in unspecified foot: Secondary | ICD-10-CM

## 2010-11-09 DIAGNOSIS — M79643 Pain in unspecified hand: Secondary | ICD-10-CM

## 2010-11-09 LAB — COMPLETE METABOLIC PANEL WITH GFR
ALT: 33 U/L (ref 0–35)
AST: 28 U/L (ref 0–37)
BUN: 18 mg/dL (ref 6–23)
Creat: 0.65 mg/dL (ref 0.40–1.20)
GFR, Est African American: >60 mL/min (ref >60)
Total Bilirubin: 0.4 mg/dL (ref 0.3–1.2)

## 2010-11-09 LAB — MAGNESIUM: Magnesium: 2.1 mg/dL (ref 1.5–2.5)

## 2010-11-09 LAB — VITAMIN B12: Vitamin B-12: 443 pg/mL (ref 211–911)

## 2010-11-09 LAB — TSH: TSH: 1.056 u[IU]/mL (ref 0.350–4.500)

## 2010-11-09 NOTE — Telephone Encounter (Signed)
Left message on vm

## 2010-11-09 NOTE — Telephone Encounter (Signed)
Call pt: All labs look great!! Lets refer to sport med.

## 2010-11-17 ENCOUNTER — Other Ambulatory Visit: Payer: Self-pay | Admitting: *Deleted

## 2010-11-17 MED ORDER — DULOXETINE HCL 60 MG PO CPEP: 60.0000 mg | ORAL_CAPSULE | Freq: Every day | ORAL | Status: DC

## 2010-12-08 ENCOUNTER — Other Ambulatory Visit: Payer: Self-pay | Admitting: *Deleted

## 2010-12-08 MED ORDER — LOSARTAN POTASSIUM 50 MG PO TABS: 50.0000 mg | ORAL_TABLET | Freq: Every day | ORAL | Status: DC

## 2011-02-01 ENCOUNTER — Encounter: Payer: Self-pay | Admitting: Family Medicine

## 2011-02-02 ENCOUNTER — Telehealth: Payer: Self-pay | Admitting: Family Medicine

## 2011-02-02 ENCOUNTER — Encounter: Payer: Self-pay | Admitting: Family Medicine

## 2011-02-02 ENCOUNTER — Ambulatory Visit
Admission: RE | Admit: 2011-02-02 | Discharge: 2011-02-02 | Disposition: A | Discharge status: Home / Self Care | Payer: 59 | Source: Ambulatory Visit | Attending: Family Medicine | Admitting: Family Medicine

## 2011-02-02 ENCOUNTER — Ambulatory Visit (INDEPENDENT_AMBULATORY_CARE_PROVIDER_SITE_OTHER): Payer: 59 | Admitting: Family Medicine

## 2011-02-02 VITALS — BP 150/96 | HR 100 | Temp 98.3°F | Ht 62.0 in | Wt 152.0 lb

## 2011-02-02 DIAGNOSIS — R599 Enlarged lymph nodes, unspecified: Secondary | ICD-10-CM

## 2011-02-02 DIAGNOSIS — D491 Neoplasm of unspecified behavior of respiratory system: Secondary | ICD-10-CM

## 2011-02-02 DIAGNOSIS — R590 Localized enlarged lymph nodes: Secondary | ICD-10-CM

## 2011-02-02 DIAGNOSIS — R591 Generalized enlarged lymph nodes: Secondary | ICD-10-CM

## 2011-02-02 DIAGNOSIS — M25579 Pain in unspecified ankle and joints of unspecified foot: Secondary | ICD-10-CM

## 2011-02-02 DIAGNOSIS — M25571 Pain in right ankle and joints of right foot: Secondary | ICD-10-CM

## 2011-02-02 NOTE — Progress Notes (Signed)
  Subjective:    Patient ID: Sara Callahan, female    DOB: 12-07-1960, 50 y.o.   MRN: [id number]  HPI   Lump in the left axilla over the last several months. Mildy tender.  No red.  No other LN.  Hx of lung tumor.  Mammogram up to date. Has about 8 months ago. No cough or SOB.  Had a pain on the left sid eof chest and under axilla adn then felt SOB and lasted all day but resolved by the next day. No fever. Used to follow he rlungs with CXr. Has been really stressed at work.  No famly of beast cancer.   Right ankle with swelling. Had a severe sprain around Halloween. Still painful at times and will swell after exercise.  Pain also over the top of her foot.   Review of Systems     Objective:   Physical Exam  Constitutional: She appears well-developed and well-nourished.  HENT:  Head: Normocephalic and atraumatic.  Musculoskeletal:       Right ankle with no sig swelling. NROM.  Left axilla with an appox deep 1 cm LN that is firm but not hard.    Psychiatric: She has a normal mood and affect.          Assessment & Plan:  Axillary lymph node that has been present for several months. At this point because of her history of a spindle cell lung tumor I do recommend a chest x-ray today. If that is normal then I'll refer her to a general surgeon for possible biopsy of the lymph node. Especially since she feels it may have been getting a little larger.  Right ankle pain and swelling-I reassured her that sometimes when you come more severe injury to a joint that it can oftentimes swell with activity. This is keeping her from exercising regularly which I am not happy about. I would like to go back and see sports medicine who she saw for left foot to see if they can provide any further assistance in evaluation of her right ankle. She did have an x-ray after her initial injury in October which was negative for fracture.

## 2011-02-02 NOTE — Telephone Encounter (Signed)
Call pt: CXR is nl. I would like to refer her to gen surg for possible bx.

## 2011-02-03 NOTE — Telephone Encounter (Signed)
Pt's daughter informed of NL CXR.  The daughter said her mom said she could take the message since pt at work.  Told the daughter to let her mom know we would like to send her to a general surgeon for possible biopsy. Jarvis Newcomer, LPN Domingo Dimes

## 2011-02-16 ENCOUNTER — Telehealth: Payer: Self-pay | Admitting: Family Medicine

## 2011-02-16 NOTE — Telephone Encounter (Signed)
Closed

## 2011-03-07 ENCOUNTER — Encounter (INDEPENDENT_AMBULATORY_CARE_PROVIDER_SITE_OTHER): Payer: Self-pay | Admitting: Surgery

## 2011-03-07 ENCOUNTER — Ambulatory Visit (INDEPENDENT_AMBULATORY_CARE_PROVIDER_SITE_OTHER): Payer: 59 | Admitting: Surgery

## 2011-03-07 VITALS — BP 134/89 | HR 96 | Ht 62.0 in | Wt 152.0 lb

## 2011-03-07 DIAGNOSIS — R2230 Localized swelling, mass and lump, unspecified upper limb: Secondary | ICD-10-CM

## 2011-03-07 DIAGNOSIS — R229 Localized swelling, mass and lump, unspecified: Secondary | ICD-10-CM

## 2011-03-07 DIAGNOSIS — D1720 Benign lipomatous neoplasm of skin and subcutaneous tissue of unspecified limb: Secondary | ICD-10-CM | POA: Insufficient documentation

## 2011-03-07 NOTE — Progress Notes (Signed)
Subjective:     Patient ID: Sara Callahan, female   DOB: 07-31-1961, 50 y.o.   MRN: [id number]  HPI  Pleasant female with a six-month history of a mass in her left armpit.  Patient noted a mass relatively small half year ago. It has gradually increased in size. It is sensitive to touch. Otherwise not particularly bothersome. She forgot about it for a while, but then noticed it was a larger. No history masses elsewhere. She has a history of a lung mass that was removed by means of a left thoracotomy she thinks about 5 years ago. She recalled it was a spindle cell tumor.  She had breast reduction surgery done a long time ago along with an abdominoplasty. No history of falls or trauma. She small dog but no history of scratches. No fevers chills or sweats. Energy level is good considering she works up to 14 hours a day.. No weight change.  She saw her primary care physician. Chest x-ray was negative. She had a mammogram done in December 2011 read as normal.  Based on increased in size and long period of the presence of the mass, she was sent to me by Dr. Linford Arnold for consideration of surgical biopsy. Review of Systems  Constitutional: Negative for fever, chills, diaphoresis, activity change, appetite change, fatigue and unexpected weight change.  HENT: Negative for ear pain, sore throat, trouble swallowing, neck pain and ear discharge.   Eyes: Negative for photophobia, discharge and visual disturbance.  Respiratory: Negative for cough, choking, chest tightness, shortness of breath and wheezing.        No nipple discharge. No history of prior breast problems. No family history of breast problems. No skin changes. Minimal breast tenderness.  Cardiovascular: Negative for chest pain, palpitations and leg swelling.  Gastrointestinal: Negative for nausea, vomiting, abdominal pain, diarrhea, constipation, anal bleeding and rectal pain.  Genitourinary: Negative for dysuria, frequency, vaginal bleeding,  vaginal discharge, difficulty urinating and pelvic pain.  Musculoskeletal: Negative for myalgias, back pain, arthralgias and gait problem.  Skin: Negative for color change, pallor and rash.  Neurological: Negative for dizziness, speech difficulty, weakness and numbness.  Hematological: Negative for adenopathy. Does not bruise/bleed easily.  Psychiatric/Behavioral: Negative for confusion and agitation. The patient is not nervous/anxious.        Objective:   Physical Exam  Constitutional: She is oriented to person, place, and time. She appears well-developed and well-nourished. No distress.  HENT:  Head: Normocephalic.  Mouth/Throat: Oropharynx is clear and moist. No oropharyngeal exudate.  Eyes: Conjunctivae and EOM are normal. Pupils are equal, round, and reactive to light. No scleral icterus.  Neck: Normal range of motion. Neck supple. No tracheal deviation present.  Cardiovascular: Normal rate, regular rhythm and intact distal pulses.   Pulmonary/Chest: Effort normal and breath sounds normal. No respiratory distress. She has no wheezes. She has no rales. She exhibits no tenderness.       Medium sized breasts with minimal ptosis. K. LOC incisions consistent with breast reduction. Mild fibrocystic change. Left greater than right breast mound tenderness. No discrete masses.  No skin changes. No nipple discharge. No retractions. No nipple bleeding.  Right axilla clean. Left axilla tubular 2x2x4 cm ellipsoid mass. Mildly tender. Mostly mobile. Not within dermis and within subcutaneous tissues.  Consistent with lymph node. No other lymphadenopathy.  Abdominal: Soft. She exhibits no distension and no mass. There is no tenderness. Hernia confirmed negative in the right inguinal area and confirmed negative in the left inguinal area.  Musculoskeletal: Normal range of motion. She exhibits no tenderness.  Lymphadenopathy:    She has no cervical adenopathy.       Right: No inguinal adenopathy  present.       Left: No inguinal adenopathy present.  Neurological: She is alert and oriented to person, place, and time. No cranial nerve deficit. She exhibits normal muscle tone. Coordination normal.  Skin: Skin is warm and dry. No rash noted. She is not diaphoretic. No erythema.  Psychiatric: She has a normal mood and affect. Her behavior is normal. Judgment and thought content normal.   Studies: Recent chest x-ray shows normality is. She had a prior left pleural based mass in 2003. 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 I do not have the pathology on the lung mass.  I cannot get copies of the mammogram [id number] from [facility] , but Dr. Shelah Lewandowsky note & the patient recalls it being completely normal.     Assessment:     Enlarging mass of left axilla (most likely a lymph node) in a patient with a history of an ipsilateral lung tumor removed.    Plan:     Given the fact that the mass has gotten larger and it is persisted for more than 6 weeks, I recommend biopsy. Because it is most likely a lymph node, I don't think core biopsy will be helpful. I recommended complete excisional biopsy. The technique that is under deep sedation versus general anesthesia was discussed. Technique risks benefits & alternatives discussed. Risks of nerve injury, infection, bleeding etc. discussed. She wishes to proceed. She's comfortable having surgery in [place] versus [place].  We will try and locate the mammogram & lung pathology from prior surgeries as well.

## 2011-03-07 NOTE — Patient Instructions (Signed)
Schedule surgery to remove mass in left axilla/armpit for biopsy & diagnosis

## 2011-03-14 ENCOUNTER — Other Ambulatory Visit (INDEPENDENT_AMBULATORY_CARE_PROVIDER_SITE_OTHER): Payer: Self-pay | Admitting: Surgery

## 2011-03-14 ENCOUNTER — Encounter (HOSPITAL_COMMUNITY): Payer: 59

## 2011-03-14 LAB — CBC
HCT: 39.9 % (ref 36.0–46.0)
MCH: 28.5 pg (ref 26.0–34.0)
MCHC: 33.6 g/dL (ref 30.0–36.0)
RDW: 13.6 % (ref 11.5–15.5)

## 2011-03-14 LAB — BASIC METABOLIC PANEL
CO2: 27 mEq/L (ref 19–32)
Calcium: 9.6 mg/dL (ref 8.4–10.5)
Potassium: 3.6 mEq/L (ref 3.5–5.1)
Sodium: 137 mEq/L (ref 135–145)

## 2011-03-14 LAB — SURGICAL PCR SCREEN
MRSA, PCR: NEGATIVE
Staphylococcus aureus: NEGATIVE

## 2011-03-23 ENCOUNTER — Ambulatory Visit (HOSPITAL_COMMUNITY)
Admission: RE | Admit: 2011-03-23 | Discharge: 2011-03-23 | Disposition: A | Discharge status: Home / Self Care | Payer: 59 | Source: Ambulatory Visit | Attending: Surgery | Admitting: Surgery

## 2011-03-23 ENCOUNTER — Other Ambulatory Visit (INDEPENDENT_AMBULATORY_CARE_PROVIDER_SITE_OTHER): Payer: Self-pay | Admitting: Surgery

## 2011-03-23 DIAGNOSIS — D1779 Benign lipomatous neoplasm of other sites: Secondary | ICD-10-CM

## 2011-03-23 DIAGNOSIS — L989 Disorder of the skin and subcutaneous tissue, unspecified: Secondary | ICD-10-CM | POA: Insufficient documentation

## 2011-03-23 DIAGNOSIS — Q828 Other specified congenital malformations of skin: Secondary | ICD-10-CM

## 2011-03-27 ENCOUNTER — Telehealth (INDEPENDENT_AMBULATORY_CARE_PROVIDER_SITE_OTHER): Payer: Self-pay

## 2011-03-27 NOTE — Telephone Encounter (Signed)
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

## 2011-03-30 NOTE — Op Note (Signed)
[name], Sara Callahan NO.:  [id number]  MEDICAL RECORD NO.:  [id number]  LOCATION:  DAYL                         FACILITY:  [facility]  PHYSICIAN:  Ardeth Sportsman, MD     DATE OF BIRTH:  November 12, 1960  DATE OF PROCEDURE:  03/23/2011 DATE OF DISCHARGE:                              OPERATIVE REPORT   PRIMARY CARE PHYSICIAN:  Nani Gasser, Sara.D.  SURGEON:  Ardeth Sportsman, MD  ASSISTANT:  RN.  PREOPERATIVE DIAGNOSIS:  Enlarging left axillary mass.  POSTOPERATIVE DIAGNOSES: 1. 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 2. Left axillary skin tag.  PROCEDURES PERFORMED: 1. Excision of left axillary mass. 2. Removal of acrochordon/skin tag.  ANESTHESIA: 1. Deep sedation with laryngeal mask airway. 2. Local anesthetic in Callahan field block.  SPECIMENS: 1. Left axillary mass. 2. Skin tag (not send).  DRAINS:  None.  ESTIMATED BLOOD LOSS:  Minimal.  COMPLICATIONS:  None apparent.  INDICATIONS:  [name] is [name] with no major medical problems, who has had [name] in her left axilla about the past 6 months.  It has gotten larger in size.  It is sensitive to touch, but has not been exclusively painful.  Workup was negative for breast etiology.  The differential diagnosis was discussed.  Options were discussed. Recommendation was made for excision.  The risks, benefits, and alternative were discussed.  Possibility of nerve injury, hematoma, seroma, or more surgery was discussed.  Possible recurrence was discussed.  Questions were answered.  She agreed to proceed.  OPERATIVE FINDINGS:  She had 2 x 3 x 3 cm well-encapsulated ellipsoid fatty mass.  It was most likely consistent with Callahan lipoma versus Callahan fatty lymph node.  She has small skin tag nearby.  DESCRIPTION OF PROCEDURE:  Informed consent was confirmed.  The patient underwent deep sedation with laryngeal mask airway without any difficulty.  She was placed in supine with  left arm open.  She had gentle left axillary roll.  Her proximal arm and axilla, and chest were prepped and draped in sterile fashion.  Callahan surgical time-out confirmed our plan.  I made Callahan gently curved incision on the inferior hairline of the axilla, starting at the midaxillary line, going more towards the posterior axillary line.  Initially, I did about 3 cm, but extended to 4 cm.  I went through the subcutaneous tissues.  Callahan little more deeply I encountered an obvious well-encapsulated mass.  Using Callahan focused blunt dissection, I was able to help enucleate part of it up into the wound. I used clips to help take feeding vessels to it.  I used cautery to free it off from the more superficial layer.  It came out well intact.  I ensured hemostasis which was good with Callahan few extra clips.  I closed the wound using 3-0 Vicryl deep dermal stitches, running 4-0 Monocryl, and Steri-Strips.  Sterile dressings were applied.  The patient went to the recovery room in stable condition.  I discussed postoperative care with the patient in the office and prior to surgery.  I have discussed with her husband as well.     Ardeth Sportsman, MD  SCG/MEDQ  D:  03/23/2011  T:  03/23/2011  Job:  [id number]  cc:   Nani Gasser, Sara.D. Fax: [phone]  Electronically Signed by [name] MD on 03/30/2011 08:24:04 AM

## 2011-04-06 ENCOUNTER — Encounter (INDEPENDENT_AMBULATORY_CARE_PROVIDER_SITE_OTHER): Payer: Self-pay | Admitting: Surgery

## 2011-04-06 ENCOUNTER — Ambulatory Visit (INDEPENDENT_AMBULATORY_CARE_PROVIDER_SITE_OTHER): Payer: 59 | Admitting: Surgery

## 2011-04-06 VITALS — BP 122/86 | HR 60 | Temp 98.6°F

## 2011-04-06 DIAGNOSIS — D1720 Benign lipomatous neoplasm of skin and subcutaneous tissue of unspecified limb: Secondary | ICD-10-CM

## 2011-04-06 DIAGNOSIS — D1739 Benign lipomatous neoplasm of skin and subcutaneous tissue of other sites: Secondary | ICD-10-CM

## 2011-04-06 NOTE — Progress Notes (Signed)
Subjective:     Patient ID: Sara Callahan, female   DOB: [birth year], 50 y.o.   MRN: [id number]  HPI  Reason for visit: Followup on left axillary mass biopsy.  Pathology: Lipoma.  The patient comes a feeling quite well. He she was using her arm for quickly. She was off narcotics within 2 days. Feels well. Denies numbness. No weakness. No swelling.  Review of Systems  Respiratory: Negative for chest tightness and shortness of breath.   Cardiovascular: Negative for chest pain and palpitations.  Musculoskeletal: Negative for myalgias, back pain, joint swelling and arthralgias.  All other systems reviewed and are negative.       Objective:   Physical Exam  Constitutional: She is oriented to person, place, and time. She appears well-developed and well-nourished. No distress.  HENT:  Head: Normocephalic.  Mouth/Throat: Oropharynx is clear and moist. No oropharyngeal exudate.  Eyes: Conjunctivae and EOM are normal. Pupils are equal, round, and reactive to light. No scleral icterus.  Neck: Normal range of motion. No tracheal deviation present.  Cardiovascular: Normal rate and intact distal pulses.   Pulmonary/Chest: Effort normal. No respiratory distress.       Left axillary incison closed.  I pulled out the tails of the SQ running stitch.    Abdominal: Soft. There is no tenderness. Hernia confirmed negative in the right inguinal area and confirmed negative in the left inguinal area.  Musculoskeletal: Normal range of motion. She exhibits no edema and no tenderness.  Lymphadenopathy:       Right: No inguinal adenopathy present.       Left: No inguinal adenopathy present.  Neurological: She is alert and oriented to person, place, and time. No cranial nerve deficit. She exhibits normal muscle tone. Coordination normal.  Skin: Skin is warm and dry. No rash noted. She is not diaphoretic. No erythema.  Psychiatric: She has a normal mood and affect. Her behavior is normal. Judgment and thought  content normal.       Assessment:     Axillary mass consistent with lipoma. Recovering well.    Plan:     Activity as tolerated.  Use clippers to manage axillary hairs until the incision flattens down within a few weeks. At this point, I think the risk of wound dehiscence or infection is fading away. Please call if there are issues but otherwise return to clinic p.r.n.  She expressed appreciation.

## 2011-04-13 ENCOUNTER — Other Ambulatory Visit: Payer: Self-pay | Admitting: Family Medicine

## 2011-04-14 ENCOUNTER — Other Ambulatory Visit: Payer: Self-pay | Admitting: Family Medicine

## 2011-04-14 NOTE — Telephone Encounter (Signed)
Requested refill for cymbalta 60 mg but after reviewing the pt chart the refill was sent on 04-13-11 for #30/1 refill.  Pharmacy([facility]) notified. Sara Newcomer, LPN Domingo Dimes

## 2011-04-27 ENCOUNTER — Ambulatory Visit (INDEPENDENT_AMBULATORY_CARE_PROVIDER_SITE_OTHER): Payer: 59 | Admitting: Family Medicine

## 2011-04-27 ENCOUNTER — Encounter: Payer: Self-pay | Admitting: Family Medicine

## 2011-04-27 VITALS — BP 127/86 | HR 104 | Wt 151.0 lb

## 2011-04-27 DIAGNOSIS — Z0000 Encounter for general adult medical examination without abnormal findings: Secondary | ICD-10-CM

## 2011-04-27 LAB — LIPID PANEL
HDL: 58 mg/dL (ref >39)
LDL Cholesterol: 137 mg/dL — ABNORMAL HIGH (ref 0–99)
Triglycerides: 99 mg/dL (ref <150)
VLDL: 20 mg/dL (ref 0–40)

## 2011-04-27 NOTE — Progress Notes (Signed)
  Subjective:     Sara Callahan is a 50 y.o. female and is here for a comprehensive physical exam. The patient reports problems - going through menopause.  History   Social History  . Marital Status: Married    Spouse Name: N/A    Number of Children: 2  . Years of Education: N/A   Occupational History  .     Social History Main Topics  . Smoking status: Never Smoker   . Smokeless tobacco: Not on file  . Alcohol Use: Yes     "once or twice a year"  . Drug Use: No  . Sexually Active: Yes -- Female partner(s)     [employer], 12 yrs educations, married, 3 kids.   Other Topics Concern  . Not on file   Social History Narrative   Some  regular exercise. Takes her calcium supplement.    Health Maintenance  Topic Date Due  . Influenza Vaccine  04/30/2012  . Mammogram  07/13/2012  . Colonoscopy  07/27/2015  . Tetanus/tdap  09/30/2019    The following portions of the patient's history were reviewed and updated as appropriate: allergies, current medications, past family history, past medical history, past social history and past surgical history.  Review of Systems A comprehensive review of systems was negative.   Objective:    BP 127/86  Pulse 104  Wt 151 lb (68.493 kg) General appearance: alert, cooperative and appears stated age Head: Normocephalic, without obvious abnormality, atraumatic Eyes: conjunctiva, EOMi PEERLA, wears glasses.  Ears: normal TM's and external ear canals both ears Nose: Nares normal. Septum midline. Mucosa normal. No drainage or sinus tenderness. Throat: lips, mucosa, and tongue normal; teeth and gums normal Neck: no adenopathy, no carotid bruit, supple, symmetrical, trachea midline and thyroid not enlarged, symmetric, no tenderness/mass/nodules Back: symmetric, no curvature. ROM normal. No CVA tenderness. Lungs: clear to auscultation bilaterally Breasts: normal appearance, no masses or tenderness Heart: regular rate and rhythm,  S1, S2 normal, no murmur, click, rub or gallop Abdomen: soft, non-tender; bowel sounds normal; no masses,  no organomegaly Extremities: extremities normal, atraumatic, no cyanosis or edema Pulses: 2+ and symmetric Skin: Skin color, texture, turgor normal. No rashes or lesions Lymph nodes: Cervical, supraclavicular, and axillary nodes normal. Neurologic: Grossly normal    Assessment:    Healthy female exam.      Plan:     See After Visit Summary for Counseling Recommendations  Start a regular exercise program and make sure you are eating a healthy diet Try to eat 4 servings of dairy a day or take a calcium supplement (500mg  twice a day). Your vaccines are up to date.  She has a biometric screening form for work She will get flu shot at work.  Labs slip given for screenig labs.  Mammo/colonoscopy is up to date.

## 2011-04-27 NOTE — Patient Instructions (Signed)
Start a regular exercise program and make sure you are eating a healthy diet Try to eat 4 servings of dairy a day or take a calcium supplement (500mg twice a day). Your vaccines are up to date.   

## 2011-04-28 LAB — COMPLETE METABOLIC PANEL WITH GFR
ALT: 31 U/L (ref 0–35)
AST: 26 U/L (ref 0–37)
Calcium: 9.2 mg/dL (ref 8.4–10.5)
Chloride: 106 mEq/L (ref 96–112)
Creat: 0.71 mg/dL (ref 0.50–1.10)
Sodium: 142 mEq/L (ref 135–145)
Total Bilirubin: 0.4 mg/dL (ref 0.3–1.2)
Total Protein: 6.8 g/dL (ref 6.0–8.3)

## 2011-07-04 ENCOUNTER — Other Ambulatory Visit: Payer: Self-pay | Admitting: Family Medicine

## 2011-08-07 ENCOUNTER — Other Ambulatory Visit: Payer: Self-pay | Admitting: Family Medicine

## 2011-09-20 ENCOUNTER — Other Ambulatory Visit: Payer: Self-pay | Admitting: *Deleted

## 2011-09-20 MED ORDER — LOSARTAN POTASSIUM 50 MG PO TABS: 50.0000 mg | ORAL_TABLET | Freq: Every day | ORAL | Status: DC

## 2011-10-09 ENCOUNTER — Other Ambulatory Visit: Payer: Self-pay | Admitting: Family Medicine

## 2011-10-18 ENCOUNTER — Other Ambulatory Visit: Payer: Self-pay | Admitting: *Deleted

## 2011-10-18 MED ORDER — FLUTICASONE PROPIONATE 50 MCG/ACT NA SUSP: 2.0000 | Freq: Every day | NASAL | Status: DC

## 2011-11-10 ENCOUNTER — Other Ambulatory Visit: Payer: Self-pay | Admitting: Family Medicine

## 2011-12-13 ENCOUNTER — Other Ambulatory Visit: Payer: Self-pay | Admitting: Physician Assistant

## 2011-12-15 ENCOUNTER — Other Ambulatory Visit: Payer: Self-pay | Admitting: *Deleted

## 2011-12-15 MED ORDER — DULOXETINE HCL 60 MG PO CPEP: 60.0000 mg | ORAL_CAPSULE | Freq: Every day | ORAL | Status: DC

## 2012-01-09 IMAGING — CR DG TIBIA/FIBULA 2V*R*
2 series · 2 of 2 positions shown · non-contrast
Comparison: None.

CLINICAL DATA: fall, pain

RIGHT TIBIA AND FIBULA - 2 VIEW

[view not recorded (1 of 2)]
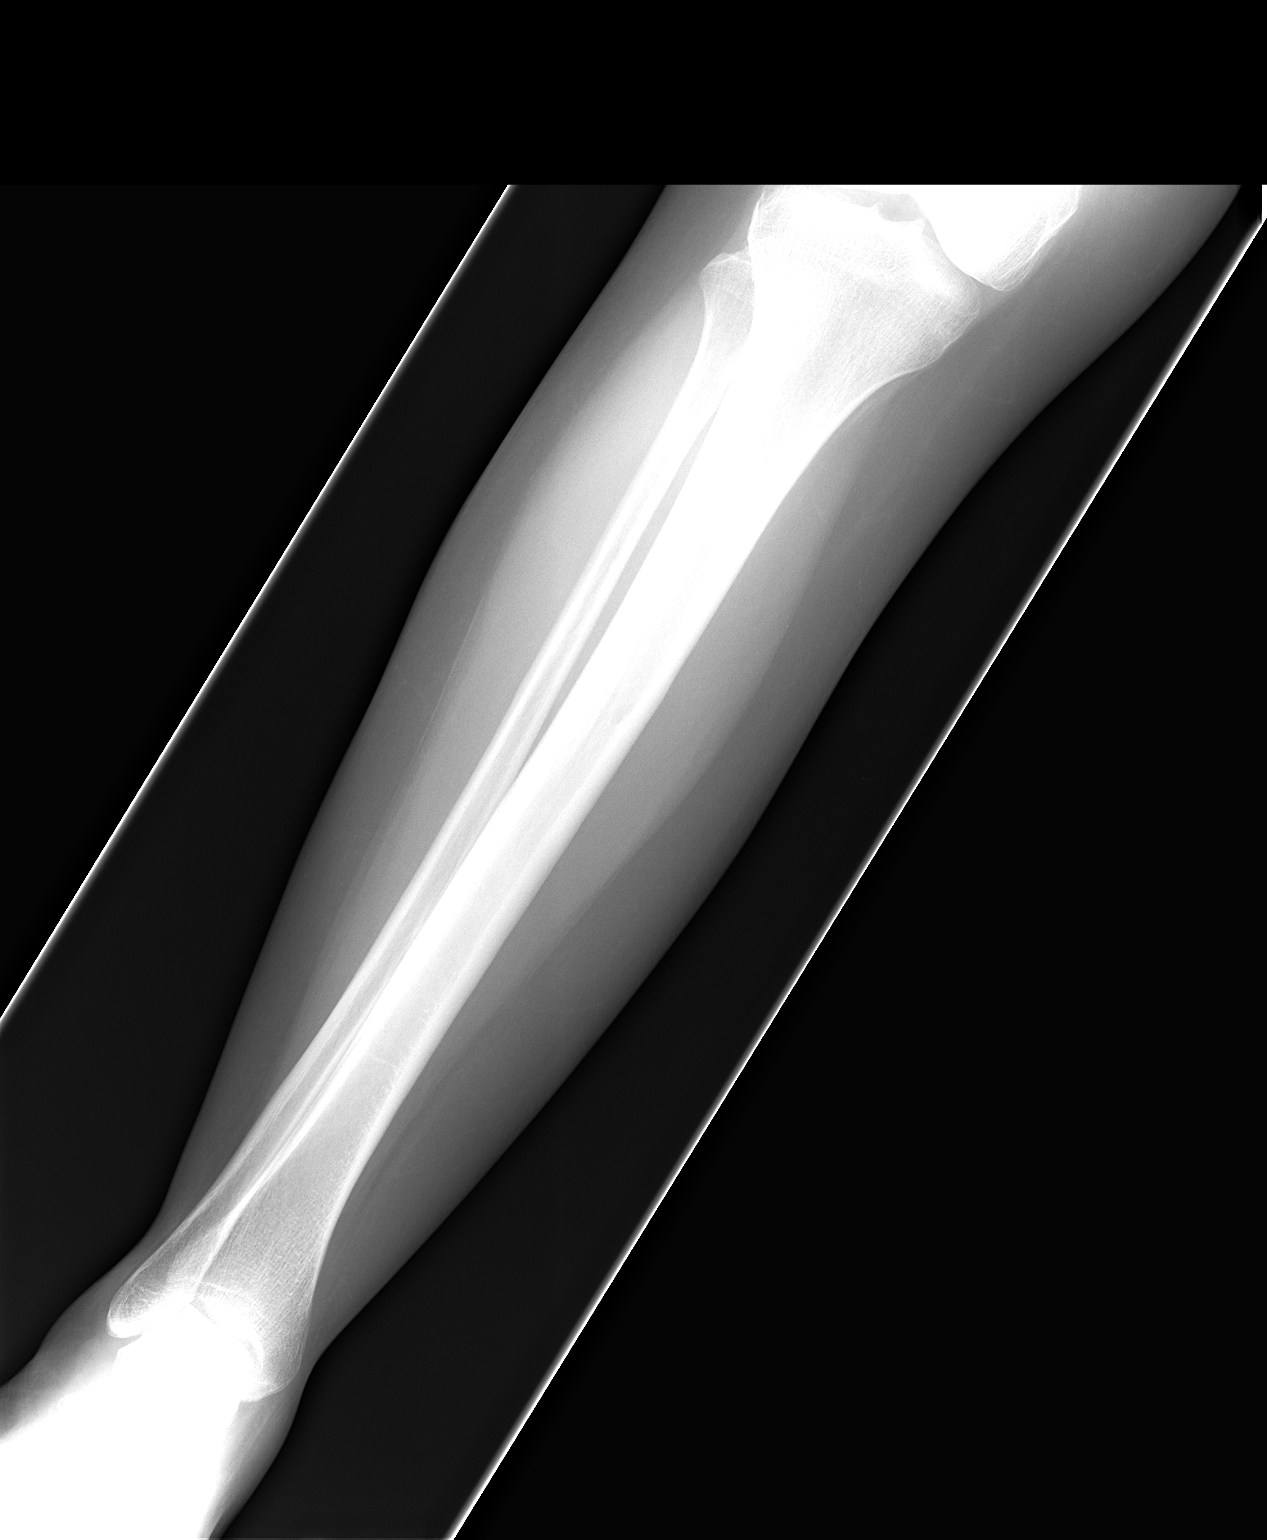

[view not recorded (2 of 2)]
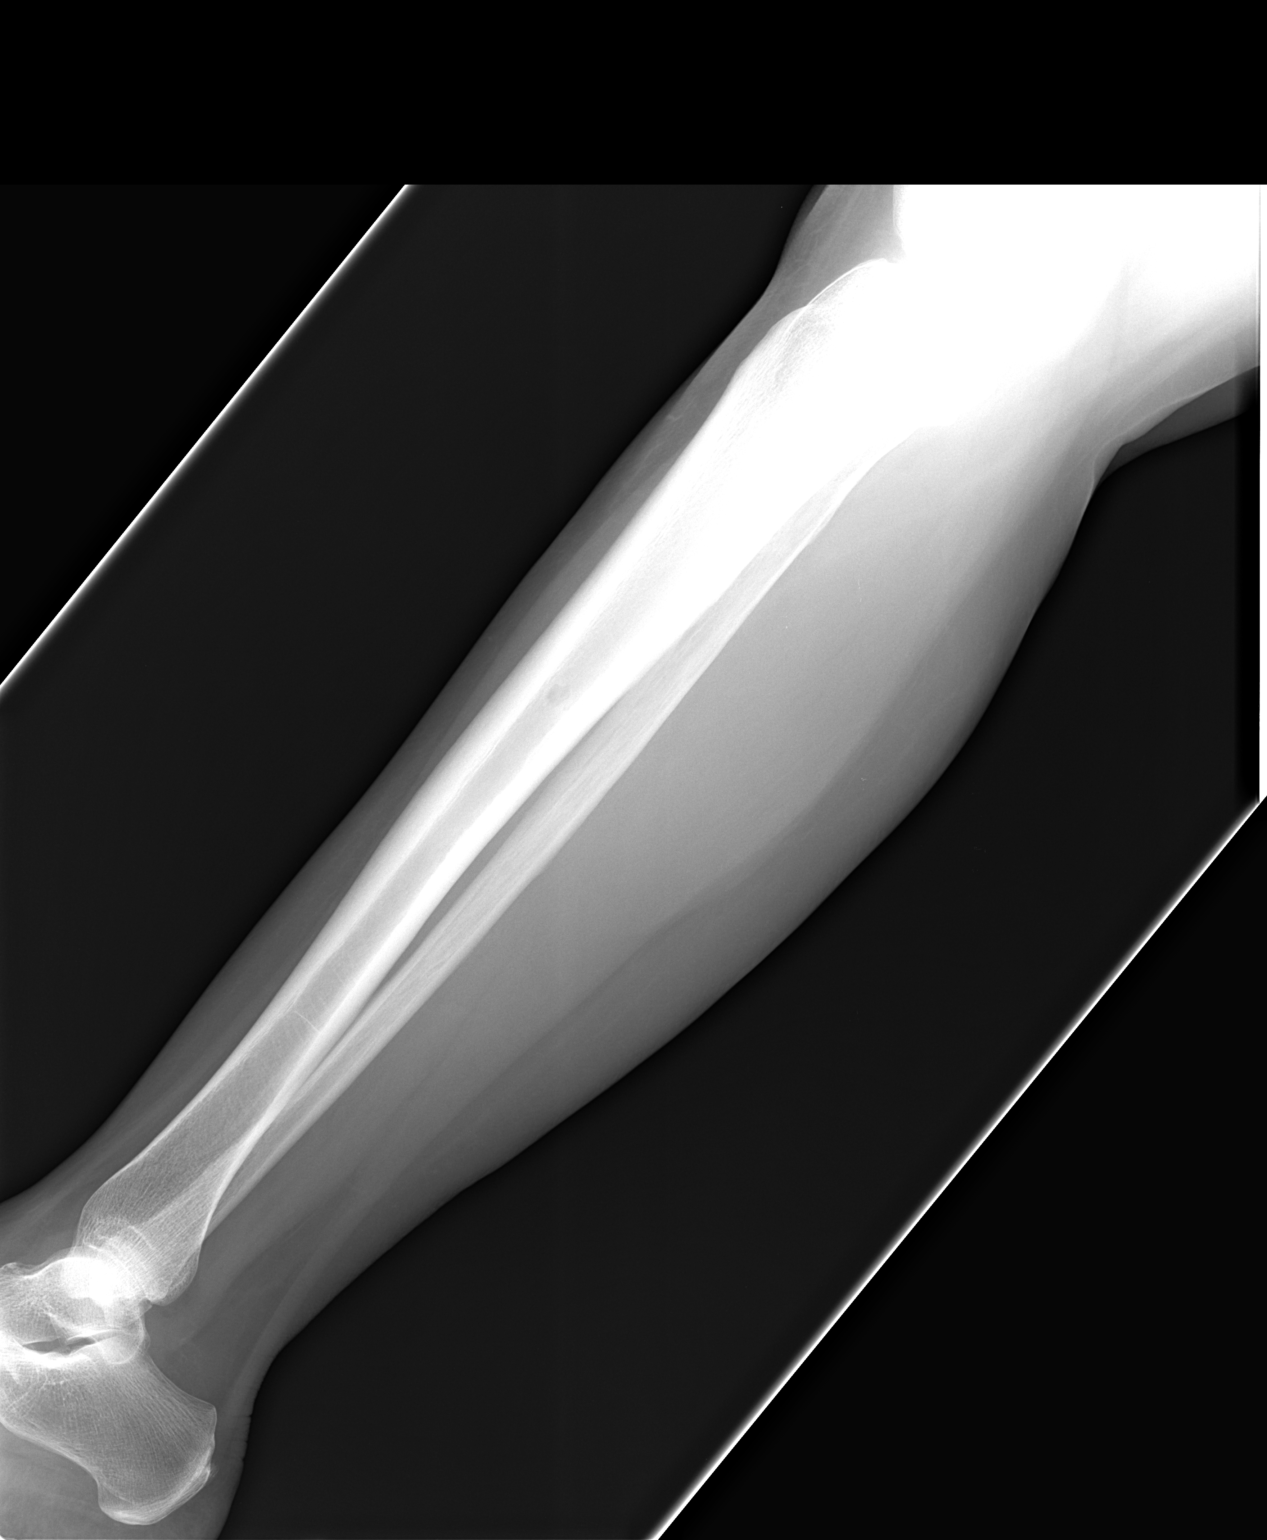

[2 of 2 positions shown; findings below may reference images not displayed]

FINDINGS: Normal alignment.  Negative for fracture.  Intact right
tibia and fibula.

Within the mid right tibia, there is 6 mm focal lucent lesion
beneath the cortex on both views.  This is nonspecific in
appearance but raises the possibility of multiple myeloma.  No
other lytic lesions identified in the visualized right lower
extremity.  Consider short-term follow-up at 3 months versus a bone
survey exam.
IMPRESSION: No acute osseous finding
6 mm right mid tibia focal lucent lesion suspicious for myeloma.
See above comment.

## 2012-01-09 IMAGING — CR DG ANKLE COMPLETE 3+V*R*
3 series · 3 of 3 positions shown · non-contrast
Comparison: 05/31/2010

CLINICAL DATA: Fall, pain and swelling

RIGHT ANKLE - COMPLETE 3+ VIEW

[view not recorded (1 of 3)]
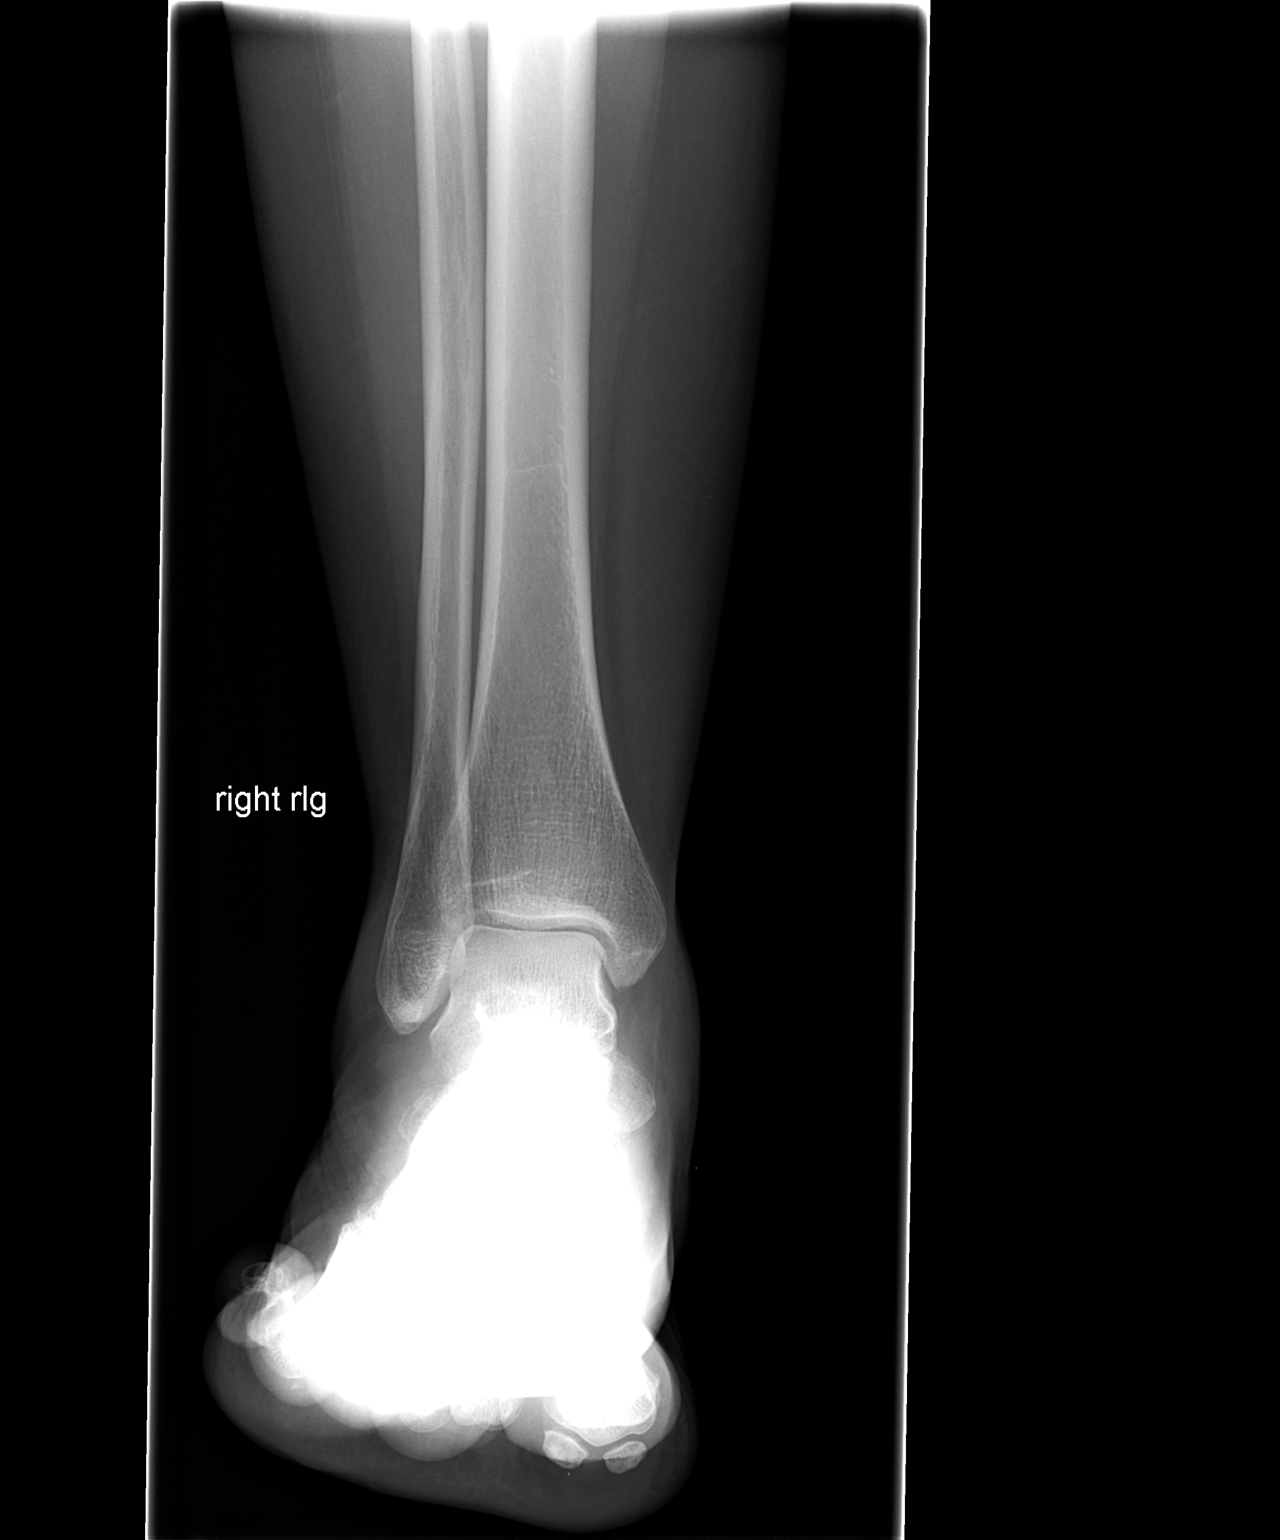

[view not recorded (2 of 3)]
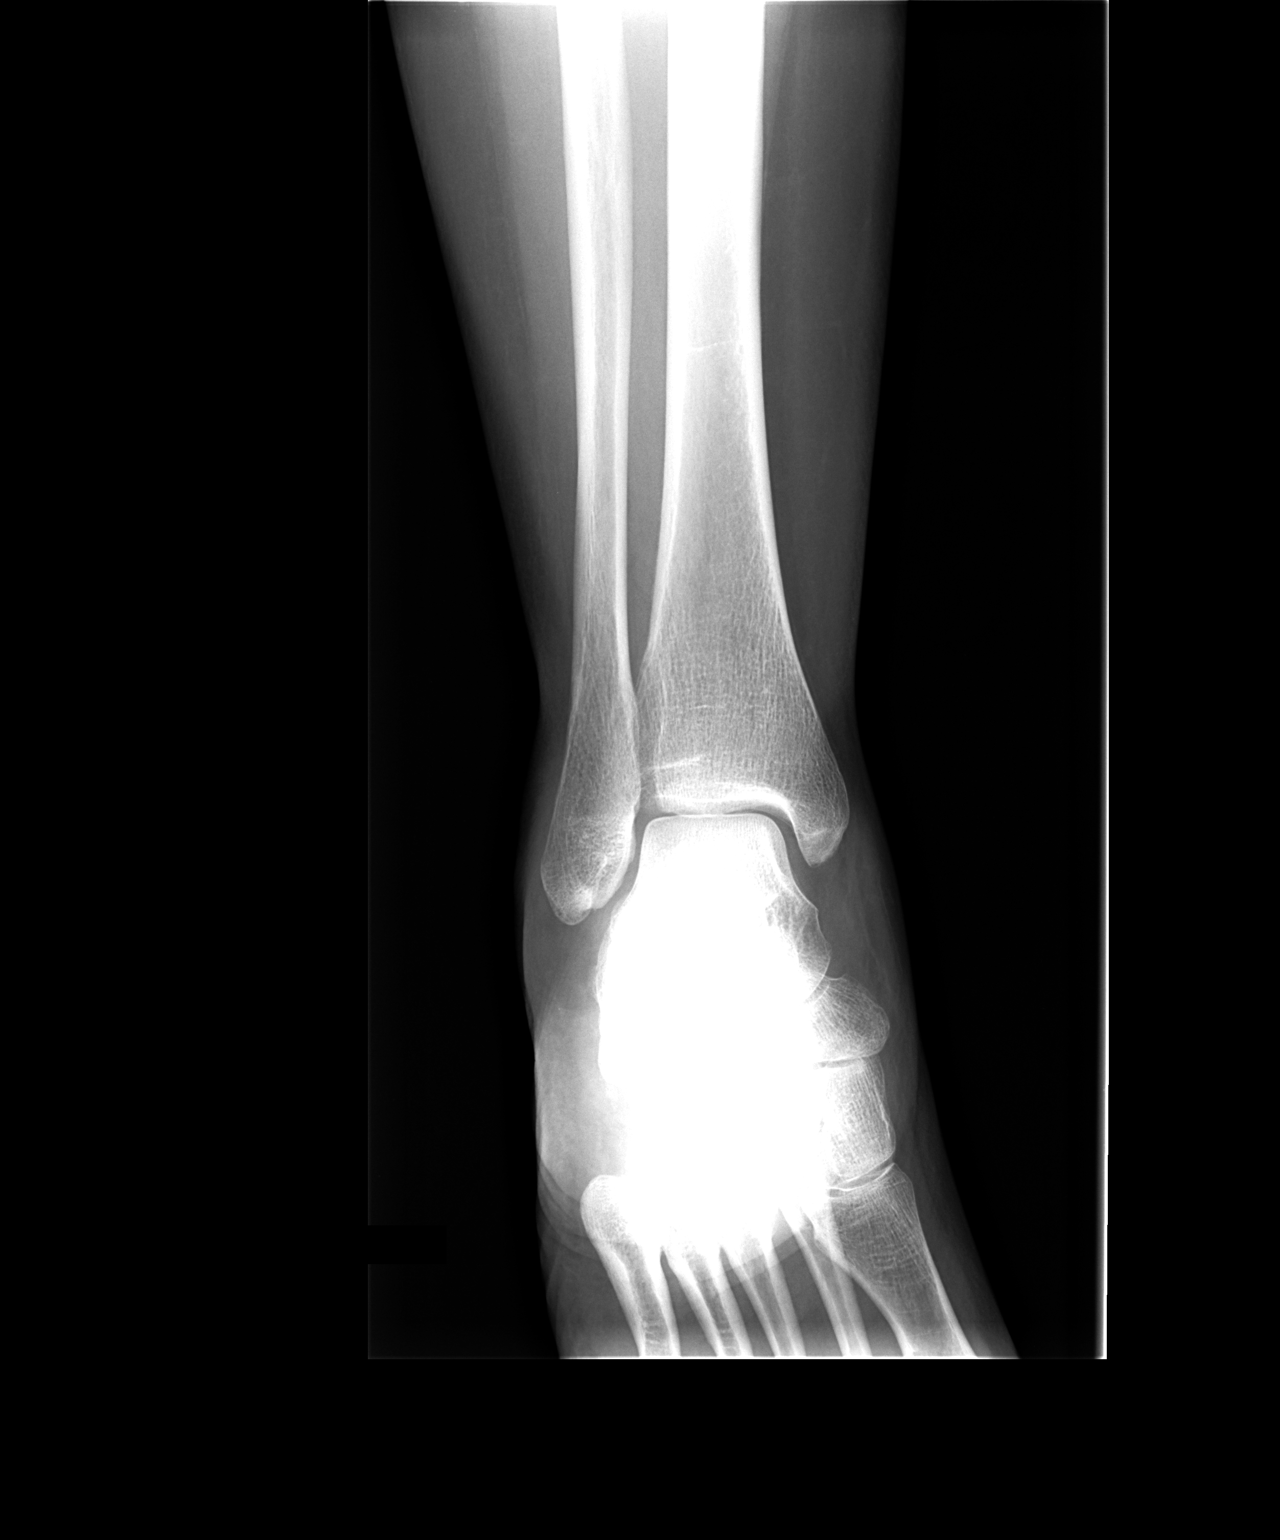

[view not recorded (3 of 3)]
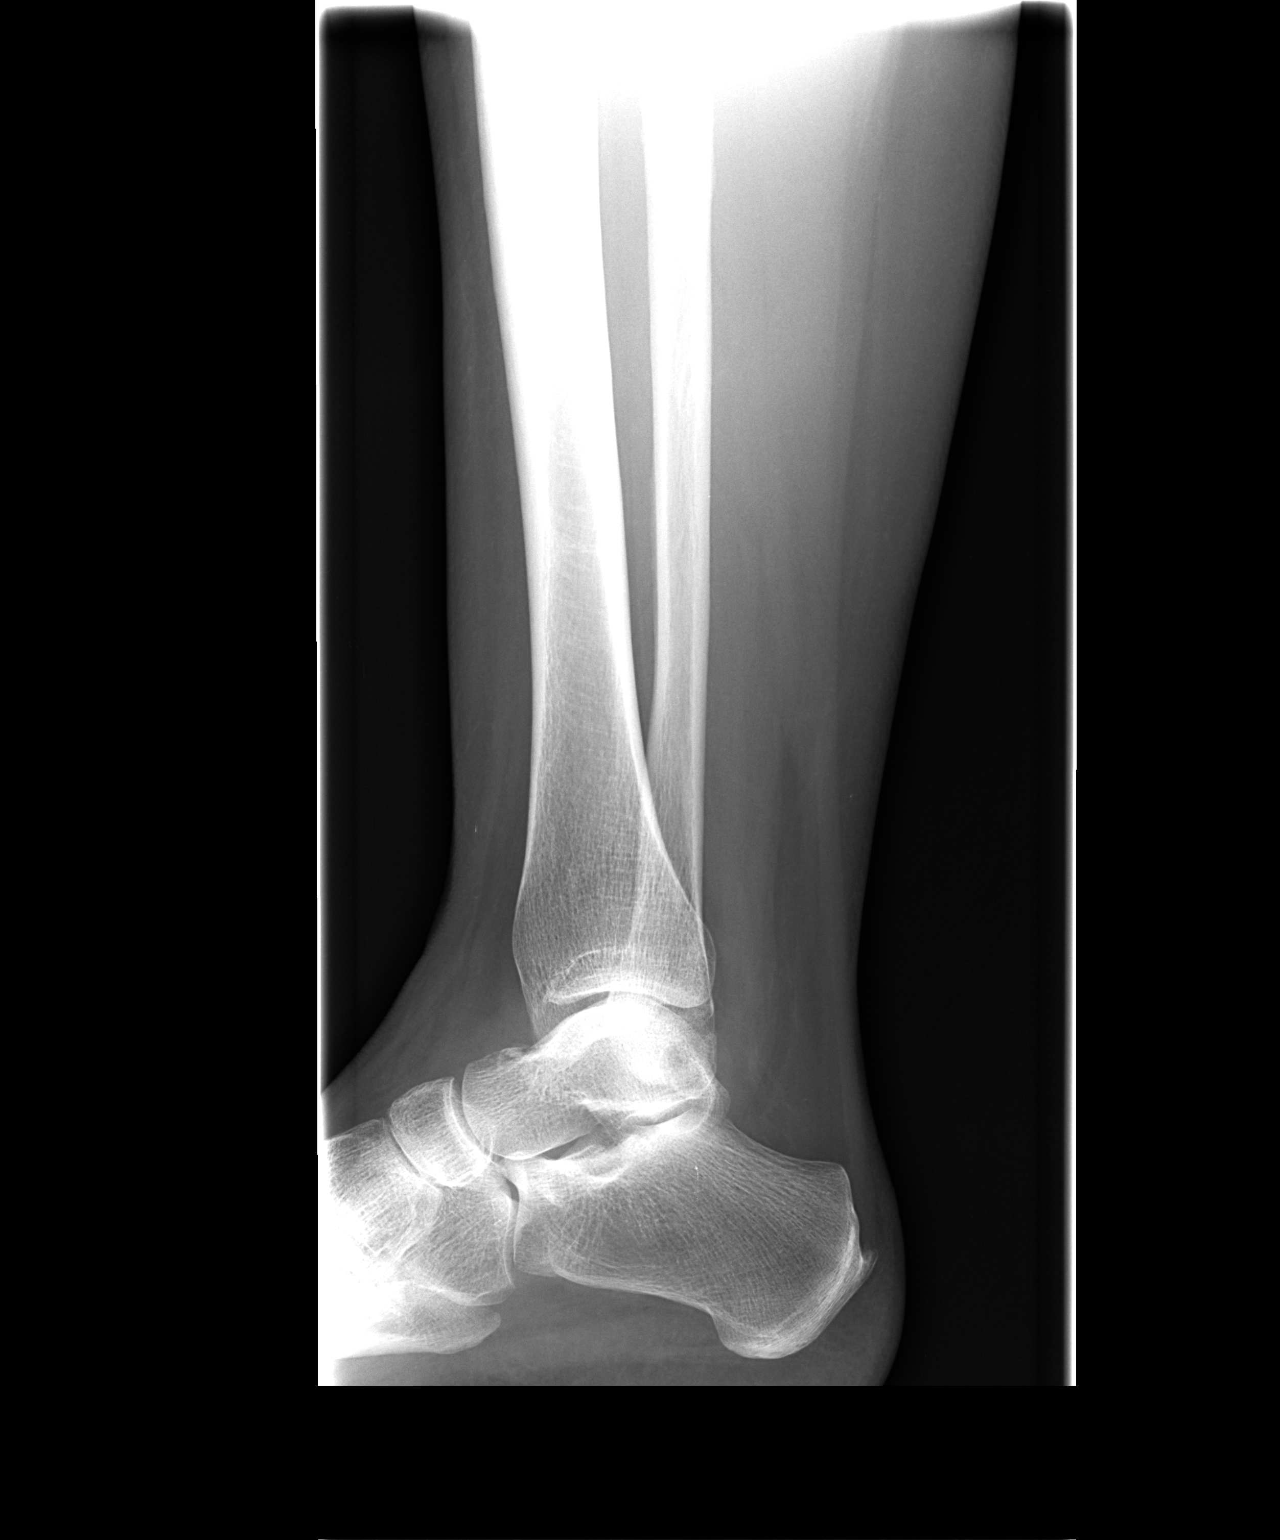

[3 of 3 positions shown; findings below may reference images not displayed]

FINDINGS: Mild swelling.  Normal alignment.  No displaced fracture.
Intact malleoli, talus and calcaneus.
IMPRESSION: No acute osseous finding

## 2012-01-15 ENCOUNTER — Other Ambulatory Visit: Payer: Self-pay | Admitting: *Deleted

## 2012-01-15 MED ORDER — DULOXETINE HCL 60 MG PO CPEP: 60.0000 mg | ORAL_CAPSULE | Freq: Every day | ORAL | Status: DC

## 2012-02-08 ENCOUNTER — Other Ambulatory Visit: Payer: Self-pay | Admitting: Family Medicine

## 2012-04-23 ENCOUNTER — Other Ambulatory Visit: Payer: Self-pay | Admitting: Family Medicine

## 2012-04-26 ENCOUNTER — Ambulatory Visit (INDEPENDENT_AMBULATORY_CARE_PROVIDER_SITE_OTHER): Payer: 59 | Admitting: Family Medicine

## 2012-04-26 ENCOUNTER — Encounter: Payer: Self-pay | Admitting: Family Medicine

## 2012-04-26 VITALS — BP 125/84 | HR 96 | Ht 62.0 in | Wt 146.0 lb

## 2012-04-26 DIAGNOSIS — Z0000 Encounter for general adult medical examination without abnormal findings: Secondary | ICD-10-CM

## 2012-04-26 DIAGNOSIS — Z23 Encounter for immunization: Secondary | ICD-10-CM

## 2012-04-26 LAB — LIPID PANEL
Cholesterol: 209 mg/dL — ABNORMAL HIGH (ref 0–200)
LDL Cholesterol: 121 mg/dL — ABNORMAL HIGH (ref 0–99)

## 2012-04-26 LAB — COMPLETE METABOLIC PANEL WITH GFR
AST: 20 U/L (ref 0–37)
Alkaline Phosphatase: 76 U/L (ref 39–117)
GFR, Est Non African American: >89 mL/min
Glucose, Bld: 89 mg/dL (ref 70–99)
Potassium: 4.2 mEq/L (ref 3.5–5.3)
Sodium: 139 mEq/L (ref 135–145)
Total Bilirubin: 0.6 mg/dL (ref 0.3–1.2)
Total Protein: 6.7 g/dL (ref 6.0–8.3)

## 2012-04-26 MED ORDER — DULOXETINE HCL 60 MG PO CPEP: 60.0000 mg | ORAL_CAPSULE | Freq: Every day | ORAL | Status: DC

## 2012-04-26 NOTE — Progress Notes (Signed)
  Subjective:     Sara Callahan is a 51 y.o. female and is here for a comprehensive physical exam. The patient reports no problems. She recently lost her job of 8 years. She was working [employer]. This is been a little difficult for her. Right now she's looking for new job.  She says her eye exam and lenses are up-to-date.  History   Social History  . Marital Status: Married    Spouse Name: Onalee Hua    Number of Children: 2  . Years of Education: N/A   Occupational History  .      [employer]   Social History Main Topics  . Smoking status: Never Smoker   . Smokeless tobacco: Not on file  . Alcohol Use: Yes     "once or twice a year"  . Drug Use: No  . Sexually Active: Yes -- Female partner(s)     [employer], 12 yrs educations, married, 3 kids.   Other Topics Concern  . Not on file   Social History Narrative   +  regular exercise. Takes her calcium supplement.    Health Maintenance  Topic Date Due  . Influenza Vaccine  03/31/2013  . Mammogram  07/13/2013  . Colonoscopy  07/27/2015  . Tetanus/tdap  09/30/2019    The following portions of the patient's history were reviewed and updated as appropriate: allergies, current medications, past family history, past medical history, past social history, past surgical history and problem list.  Review of Systems A comprehensive review of systems was negative.   Objective:    BP 125/84  Pulse 96  Ht 5\' 2"  (1.575 m)  Wt 146 lb (66.225 kg)  BMI 26.70 kg/m2 General appearance: alert, cooperative and appears stated age Head: Normocephalic, without obvious abnormality, atraumatic Eyes: conj clear, EOMi, PEERLA Ears: normal TM's and external ear canals both ears Nose: Nares normal. Septum midline. Mucosa normal. No drainage or sinus tenderness. Throat: lips, mucosa, and tongue normal; teeth and gums normal Neck: no adenopathy, no carotid bruit, no JVD, supple, symmetrical, trachea midline and thyroid  not enlarged, symmetric, no tenderness/mass/nodules Back: symmetric, no curvature. ROM normal. No CVA tenderness. Lungs: clear to auscultation bilaterally Breasts: normal appearance, no masses or tenderness Heart: regular rate and rhythm, S1, S2 normal, no murmur, click, rub or gallop Abdomen: soft, non-tender; bowel sounds normal; no masses,  no organomegaly Extremities: extremities normal, atraumatic, no cyanosis or edema Pulses: 2+ and symmetric Skin: Skin color, texture, turgor normal. No rashes or lesions Lymph nodes: Cervical, supraclavicular, and axillary nodes normal. Neurologic: Grossly normal    Assessment:    Healthy female exam.      Plan:     See After Visit Summary for Counseling Recommendations  Continue a regular exercise program and make sure you are eating a healthy diet Try to eat 4 servings of dairy a day  Your vaccines are up to date.  Due for CMP and lipids.   Due for Hep C screening Flu vaccine given.

## 2012-04-26 NOTE — Patient Instructions (Addendum)
Start a regular exercise program and make sure you are eating a healthy diet Try to eat 4 servings of dairy a day  Your vaccines are up to date.  We will call you with your lab results. If you don't here from [place] in about a week then please give [place] a call at [phone].

## 2012-06-09 ENCOUNTER — Other Ambulatory Visit: Payer: Self-pay | Admitting: Family Medicine

## 2012-10-01 ENCOUNTER — Encounter: Payer: Self-pay | Admitting: Family Medicine

## 2012-10-01 ENCOUNTER — Ambulatory Visit (INDEPENDENT_AMBULATORY_CARE_PROVIDER_SITE_OTHER): Payer: 59 | Admitting: Family Medicine

## 2012-10-01 VITALS — BP 129/86 | HR 93 | Ht 62.0 in | Wt 156.0 lb

## 2012-10-01 DIAGNOSIS — R21 Rash and other nonspecific skin eruption: Secondary | ICD-10-CM

## 2012-10-01 NOTE — Progress Notes (Signed)
  Subjective:    Patient ID: Sara Callahan, female    DOB: [birth year], 52 y.o.   MRN: [id number]  HPI Rash at base of neck started 3 weeks ago. no changes in soaps,detergent,lotion on the base of her head spreading downward towards her neck. she has been using neosporin for 3 weeks told her to stop using that . asked if she has tried cortisone cream and she has no hx of psoriasis no one else with similar sxs.  Say is intensly itchey.  Has ben using selsun blue the last couple of days.      Review of Systems     Objective:   Physical Exam  Constitutional: She is oriented to person, place, and time. She appears well-developed and well-nourished.  Neurological: She is alert and oriented to person, place, and time.  Skin: Skin is warm and dry. Rash noted.     She has a mildly erythematous rash at the base of her neck and 2 separate lesions on the neck. . It is mildly papular. A few excoriations. There are a couple papules have a dried scale around them. No active drainage or lesions. No cysts or abscess.  Psychiatric: She has a normal mood and affect. Her behavior is normal.          Assessment & Plan:  Rash on scalp-KOH scraping performed to evaluate for fungus versus contact dermatitis. If the KOH is positive will treat with antifungal. If negative we'll treat with a topical steroid cream. In the meantime stop the Neosporin and can just use moisturizing agents. Will call us if the results are available. She has not changed shampoos et [name].

## 2012-10-01 NOTE — Patient Instructions (Addendum)
Stop the neosporin.   

## 2012-10-02 ENCOUNTER — Other Ambulatory Visit: Payer: Self-pay | Admitting: Family Medicine

## 2012-10-02 ENCOUNTER — Telehealth: Payer: Self-pay | Admitting: *Deleted

## 2012-10-02 MED ORDER — TRIAMCINOLONE ACETONIDE 0.5 % EX OINT: TOPICAL_OINTMENT | Freq: Two times a day (BID) | CUTANEOUS | Status: DC

## 2012-10-02 NOTE — Telephone Encounter (Signed)
Error

## 2013-01-10 ENCOUNTER — Other Ambulatory Visit: Payer: Self-pay | Admitting: Family Medicine

## 2013-02-15 ENCOUNTER — Other Ambulatory Visit: Payer: Self-pay | Admitting: Family Medicine

## 2013-04-21 ENCOUNTER — Other Ambulatory Visit: Payer: Self-pay | Admitting: Family Medicine

## 2013-05-05 ENCOUNTER — Other Ambulatory Visit: Payer: Self-pay | Admitting: Family Medicine

## 2013-05-11 ENCOUNTER — Other Ambulatory Visit: Payer: Self-pay | Admitting: Family Medicine

## 2013-06-14 ENCOUNTER — Other Ambulatory Visit: Payer: Self-pay | Admitting: Family Medicine

## 2013-06-22 ENCOUNTER — Other Ambulatory Visit: Payer: Self-pay | Admitting: Family Medicine

## 2013-06-23 ENCOUNTER — Telehealth: Payer: Self-pay | Admitting: *Deleted

## 2013-06-23 NOTE — Telephone Encounter (Signed)
Pt passed due for appt with provider. Informed pt that I would only be sending 2 weeks of her Losartan to pharmacy due to [place] letting her know that she passed due on previous refill.  Meyer Cory, LPN

## 2013-07-06 ENCOUNTER — Emergency Department
Admission: EM | Admit: 2013-07-06 | Discharge: 2013-07-06 | Disposition: A | Discharge status: Home / Self Care | Payer: 59 | Source: Home / Self Care | Attending: Family Medicine | Admitting: Family Medicine

## 2013-07-06 ENCOUNTER — Encounter: Payer: Self-pay | Admitting: Emergency Medicine

## 2013-07-06 ENCOUNTER — Emergency Department (INDEPENDENT_AMBULATORY_CARE_PROVIDER_SITE_OTHER): Payer: 59

## 2013-07-06 DIAGNOSIS — M25473 Effusion, unspecified ankle: Secondary | ICD-10-CM

## 2013-07-06 DIAGNOSIS — R51 Headache: Secondary | ICD-10-CM

## 2013-07-06 DIAGNOSIS — J3489 Other specified disorders of nose and nasal sinuses: Secondary | ICD-10-CM

## 2013-07-06 DIAGNOSIS — K449 Diaphragmatic hernia without obstruction or gangrene: Secondary | ICD-10-CM

## 2013-07-06 DIAGNOSIS — M25471 Effusion, right ankle: Secondary | ICD-10-CM

## 2013-07-06 MED ORDER — CYCLOBENZAPRINE HCL 10 MG PO TABS: ORAL_TABLET | ORAL | Status: DC

## 2013-07-06 NOTE — ED Notes (Addendum)
Sara Callahan complains of a headache and neck pain for 2.5 weeks. Her headache is a stabbing and aching pain and is a 7/10. Her neck pain is an aching pain and is a 7/10. She felt nauseated due to the pain. She has been cutting her blood pressure pill, losartan, in half. Her blood pressure is elevated to 154/105.  She reports dryness in her nose and pressure in her face, body aches and eye pain for 2.5 weeks.    Noted swelling in her right ankle for a couple of months.

## 2013-07-06 NOTE — ED Provider Notes (Signed)
CSN: [id number]     Arrival date & time 07/06/13  1106 History   First MD Initiated Contact with Patient 07/06/13 1149     Chief Complaint  Patient presents with  . Headache    2.5 weeks  . Neck Pain    2.5 weeks     HPI Comments: Patient complains of a two week history of persistent left occipital headache that radiates to her left neck and scapula.  The headache awakens her at night.  She occasionally has nausea because of the headache but no vomiting.  No localizing neurologic symptoms.  No fevers, chills, and sweats.  She states that she frequently has frontal sinus type headaches (which she also has now), but her new headache is distinctly different.  Her headache is partly improved with Ibuprofen 400mg . She states that she did not take her BP medication yesterday.  She also complains of persistent painless swelling in her right lateral ankle.  She states that she fell and sprained her right ankle 3 years ago and has had persistent swelling since then.  The right ankle occasionally feels unstable.  Review of chart indicates a past history of spindle cell malignant tumor left lung/pleura.  A previous CT head w/o contrast 04/24/2006 revealed a remote left periventricular infarct.  Patient is a 52 y.o. female presenting with headaches. The history is provided by the patient.  Headache Location: left occipital. Quality:  Stabbing Radiates to:  L neck (left scapula) Severity currently:  7/10 Severity at highest:  7/10 Onset quality:  Sudden Duration:  2 weeks Timing:  Constant Progression:  Unchanged Chronicity:  New Similar to prior headaches: no   Context: bright light   Relieved by:  Nothing Worsened by:  Light Associated symptoms: congestion, eye pain, facial pain, nausea, neck pain, photophobia and sinus pressure   Associated symptoms: no blurred vision, no cough, no dizziness, no drainage, no ear pain, no fatigue, no fever, no focal weakness, no hearing loss, no loss of balance,  no myalgias, no near-syncope, no numbness, no paresthesias, no seizures, no sore throat, no swollen glands, no syncope, no tingling, no URI, no visual change, no vomiting and no weakness     Past Medical History  Diagnosis Date  . Hyperlipidemia   . Hypertension   . 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 Cesarean section  53yrs ago, 18 yrs ago    x2  . Abdominal hysterectomy  14 yrs ago  . Knee surgery  8 yrs ago    Left   . Foot surgery  1.5 yrs ago  . Lung tumor removed  5 yrs ago    LT spindle  . Lipoma resection  Aug2012    Left axilla   Family History  Problem Relation Age of Onset  . Depression      cousins  . Diabetes      grandparents  . Heart attack      grandparents  . Hypertension      grandparents  . Osteoporosis Mother   . Throat cancer Paternal Uncle   . Uterine cancer Mother    History  Substance Use Topics  . Smoking status: Never Smoker   . Smokeless tobacco: Never Used  . Alcohol Use: Yes     Comment: "once or twice a year"   OB History   Grav Para Term Preterm Abortions TAB SAB Ect Mult Living  Review of Systems  Constitutional: Negative for fever and fatigue.  HENT: Positive for congestion and sinus pressure. Negative for ear pain, hearing loss, postnasal drip and sore throat.   Eyes: Positive for photophobia and pain. Negative for blurred vision.  Respiratory: Negative for cough.   Cardiovascular: Negative for syncope and near-syncope.  Gastrointestinal: Positive for nausea. Negative for vomiting.  Musculoskeletal: Positive for neck pain. Negative for myalgias.       Right ankle swelling without pain  Neurological: Positive for headaches. Negative for dizziness, focal weakness, seizures, numbness, paresthesias and loss of balance.    Allergies  Decongestant; Lisinopril; Procaine hcl; Propoxyphene-acetaminophen; and Sulfonamide derivatives  Home  Medications   Current Outpatient Rx  Name  Route  Sig  Dispense  Refill  . calcium carbonate 200 MG capsule   Oral   Take 250 mg by mouth 2 (two) times daily with a meal.           . DULoxetine (CYMBALTA) 60 MG capsule      TAKE ONE CAPSULE BY MOUTH ONE TIME DAILY    30 capsule   0   . fluticasone (FLONASE) 50 MCG/ACT nasal spray      PLACE 2 SPRAYS INTO THE NOSE DAILY.   16 g   0     PT NEEDS APPOINTMENT   . levalbuterol (XOPENEX HFA) 45 MCG/ACT inhaler   Inhalation   Inhale 1-2 puffs into the lungs every 4 (four) hours as needed.           Marland Kitchen losartan (COZAAR) 50 MG tablet      TAKE ONE TABLET BY MOUTH ONE TIME DAILY    15 tablet   0     MUST MAKE APPOINTMENT BEFORE ANY FURTHER REFILLS   . Multiple Vitamin (MULTIVITAMIN) tablet   Oral   Take 1 tablet by mouth daily.           Marland Kitchen triamcinolone ointment (KENALOG) 0.5 %   Topical   Apply topically 2 (two) times daily.   30 g   0   . cyclobenzaprine (FLEXERIL) 10 MG tablet      Take one tab by mouth 3 times daily as needed for headache   20 tablet   0    BP 154/105  Pulse 105  Temp(Src) 98 F (36.7 C) (Oral)  Ht 5\' 2"  (1.575 m)  Wt 163 lb (73.936 kg)  BMI 29.81 kg/m2  SpO2 98% Physical Exam  Neck:    Neck has full range of motion.  Left neck reveals tenderness over the sternocleidomastoid muscle and trapezius muscle as noted on diagram. Neck is Supple.  Tender shotty left posterior nodes are palpated.  No thyromegaly.  Carotids have normal upstrokes.  Nursing notes and Vital Signs reviewed. Appearance:  Patient appears healthy, stated age, and in no acute distress Eyes:  Pupils are equal, round, and reactive to light and accomodation.  Extraocular movement is intact.  Conjunctivae are not inflamed.  Fundi are benign.  Mild photophobia present.  Ears:  Canals normal.  Tympanic membranes normal.  Nose:  Mildly congested turbinates. Left maxillary sinus tenderness is present.  Mouth/Pharynx:   Normal Lungs:  Clear to auscultation.  Breath sounds are equal.  Heart:  Regular rate and rhythm without murmurs, rubs, or gallops.  Abdomen:  Nontender without masses or hepatosplenomegaly.  Bowel sounds are present.  No CVA or flank tenderness.  Extremities:  No edema.  No calf tenderness.  Right ankle has mild swelling lateral malleolus  without tenderness.  Right ankle joint stable.  With resisted eversion, mild tenderness palpated over posterior tibial tendon. Skin:  No rash present.  Neurologic:  Cranial nerves 2 through 12 are normal.  Patellar, achilles, and elbow reflexes are normal.  Cerebellar function is intact (finger-to-nose and rapid alternating hand movement).  Gait and station are normal.  Distal sensation and strength intact.   ED Course  Procedures  none    Imaging Review Dg Sinuses Complete  07/06/2013   CLINICAL DATA:  Sinus pain times 10 days  EXAM: PARANASAL SINUSES - COMPLETE 3 + VIEW  COMPARISON:  Prior head CT 04/23/2006  FINDINGS: The paranasal sinus are aerated. There is no evidence of sinus opacification air-fluid levels or mucosal thickening. No significant bone abnormalities are seen.  IMPRESSION: Negative.   Electronically Signed   By: Malachy Moan M.D.   On: 07/06/2013 13:32   Dg Chest 2 View  07/06/2013   CLINICAL DATA:  Headache and neck pain and left shoulder pain.  EXAM: CHEST  2 VIEW  COMPARISON:  Chest x-ray dated 02/02/2011  FINDINGS: Heart size and pulmonary vascularity are normal. There is a large hiatal hernia. Minimal scarring at the left lung base. No effusions. No acute osseous abnormality.  IMPRESSION: No acute abnormality.  Large hiatal hernia.   Electronically Signed   By: Geanie Cooley M.D.   On: 07/06/2013 13:28      MDM   1. Headache(784.0) ?etiology.  Chart reviewed:  Previous CT of head 04/24/2006 showed a remote left periventricular infarct.  Note normal neurologic exam today.    2. Ankle swelling, right; suspect ankle laxity from old  ankle sprain, possibly with component of posterior tibial tendonitis   Note decreased BP control today   Rx for Flexeril.  Schedule CT scan head ASAP.  Followup with PCP Recommend Tylenol 1000mg  once or twice daily as needed for headache.  Wear elastic ankle brace on right ankle daytime. Followup with PCP for BP Recommend followup with Dr. Rodney Langton for right ankle swelling. If symptoms become significantly worse during the night or over the weekend, proceed to the local emergency room.     Lattie Haw, MD 07/06/13 [phone]

## 2013-07-07 ENCOUNTER — Telehealth: Payer: Self-pay | Admitting: Emergency Medicine

## 2013-07-08 ENCOUNTER — Ambulatory Visit (HOSPITAL_BASED_OUTPATIENT_CLINIC_OR_DEPARTMENT_OTHER): Payer: 59

## 2013-07-09 ENCOUNTER — Encounter: Payer: Self-pay | Admitting: Family Medicine

## 2013-07-09 ENCOUNTER — Ambulatory Visit (INDEPENDENT_AMBULATORY_CARE_PROVIDER_SITE_OTHER): Payer: 59 | Admitting: Family Medicine

## 2013-07-09 VITALS — BP 146/99 | HR 103 | Resp 18 | Wt 163.0 lb

## 2013-07-09 DIAGNOSIS — I10 Essential (primary) hypertension: Secondary | ICD-10-CM

## 2013-07-09 DIAGNOSIS — R590 Localized enlarged lymph nodes: Secondary | ICD-10-CM

## 2013-07-09 DIAGNOSIS — R51 Headache: Secondary | ICD-10-CM

## 2013-07-09 DIAGNOSIS — R599 Enlarged lymph nodes, unspecified: Secondary | ICD-10-CM

## 2013-07-09 MED ORDER — LOSARTAN POTASSIUM 50 MG PO TABS: 50.0000 mg | ORAL_TABLET | Freq: Every day | ORAL | Status: DC

## 2013-07-09 MED ORDER — LEVALBUTEROL TARTRATE 45 MCG/ACT IN AERO: 1.0000 | INHALATION_SPRAY | RESPIRATORY_TRACT | Status: DC | PRN

## 2013-07-09 MED ORDER — FLUTICASONE PROPIONATE 50 MCG/ACT NA SUSP: NASAL | Status: DC
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
## 2013-07-10 ENCOUNTER — Telehealth: Payer: Self-pay | Admitting: *Deleted

## 2013-07-16 ENCOUNTER — Encounter: Payer: Self-pay | Admitting: Family Medicine

## 2013-07-17 ENCOUNTER — Telehealth: Payer: Self-pay | Admitting: *Deleted

## 2013-07-17 NOTE — Telephone Encounter (Signed)
Xopenex not covered by pt's insurance. Placed sample of Proair up front for pt to come by and pick up. Pt will use and call back if she feels is works well or not.  Meyer Cory, LPN

## 2013-07-21 ENCOUNTER — Other Ambulatory Visit: Payer: Self-pay | Admitting: *Deleted

## 2013-07-21 ENCOUNTER — Other Ambulatory Visit: Payer: Self-pay | Admitting: Family Medicine

## 2013-07-21 MED ORDER — ALBUTEROL SULFATE HFA 108 (90 BASE) MCG/ACT IN AERS: 1.0000 | INHALATION_SPRAY | RESPIRATORY_TRACT | Status: DC | PRN

## 2013-08-04 ENCOUNTER — Telehealth: Payer: Self-pay

## 2013-08-04 NOTE — Telephone Encounter (Signed)
Patient called and left a message on nurse line asking for a return call.   Returned Call: Left message asking patient to call back.  

## 2013-08-04 NOTE — Telephone Encounter (Signed)
She is allergic to sympathomimetic medications and is unable to take albuterol inhalers. I called patient to see what is the side effect of the medication.

## 2013-08-14 ENCOUNTER — Other Ambulatory Visit: Payer: Self-pay | Admitting: Family Medicine

## 2013-08-18 LAB — COMPLETE METABOLIC PANEL WITH GFR
ALT: 19 U/L (ref 0–35)
AST: 14 U/L (ref 0–37)
Albumin: 4.3 g/dL (ref 3.5–5.2)
Alkaline Phosphatase: 81 U/L (ref 39–117)
BUN: 18 mg/dL (ref 6–23)
CO2: 24 mEq/L (ref 19–32)
Calcium: 9.2 mg/dL (ref 8.4–10.5)
Chloride: 106 mEq/L (ref 96–112)
Creat: 0.61 mg/dL (ref 0.50–1.10)
GFR, Est African American: >89 mL/min
GFR, Est Non African American: >89 mL/min
Glucose, Bld: 93 mg/dL (ref 70–99)
Potassium: 4.2 mEq/L (ref 3.5–5.3)
Sodium: 141 mEq/L (ref 135–145)
Total Bilirubin: 0.4 mg/dL (ref 0.3–1.2)
Total Protein: 6.7 g/dL (ref 6.0–8.3)

## 2013-08-18 LAB — LIPID PANEL
Cholesterol: 228 mg/dL — ABNORMAL HIGH (ref 0–200)
HDL: 60 mg/dL (ref >39)
LDL Cholesterol: 145 mg/dL — ABNORMAL HIGH (ref 0–99)
Total CHOL/HDL Ratio: 3.8 Ratio
Triglycerides: 117 mg/dL (ref <150)
VLDL: 23 mg/dL (ref 0–40)

## 2013-08-20 ENCOUNTER — Encounter: Payer: Self-pay | Admitting: Family Medicine

## 2013-08-20 ENCOUNTER — Ambulatory Visit (INDEPENDENT_AMBULATORY_CARE_PROVIDER_SITE_OTHER): Payer: 59 | Admitting: Family Medicine

## 2013-08-20 VITALS — BP 128/85 | HR 92 | Temp 98.0°F | Ht 62.0 in | Wt 161.0 lb

## 2013-08-20 DIAGNOSIS — R599 Enlarged lymph nodes, unspecified: Secondary | ICD-10-CM

## 2013-08-20 DIAGNOSIS — L259 Unspecified contact dermatitis, unspecified cause: Secondary | ICD-10-CM

## 2013-08-20 DIAGNOSIS — I10 Essential (primary) hypertension: Secondary | ICD-10-CM

## 2013-08-20 DIAGNOSIS — L309 Dermatitis, unspecified: Secondary | ICD-10-CM

## 2013-08-20 DIAGNOSIS — R591 Generalized enlarged lymph nodes: Secondary | ICD-10-CM

## 2013-08-20 MED ORDER — CLOBETASOL PROPIONATE 0.05 % EX CREA: 1.0000 "application " | TOPICAL_CREAM | Freq: Two times a day (BID) | CUTANEOUS | Status: DC

## 2013-08-20 NOTE — Progress Notes (Signed)
   Subjective:    Patient ID: Sara Callahan, female    DOB: May 27, 1961, 53 y.o.   MRN: [id number]  HPI Hypertension- Pt denies chest pain, SOB, dizziness, or heart palpitations.  Taking meds as directed w/o problems.  Denies medication side effects.    F/U Lymphadenopathy - she does have a lymph node behind the left ear. Had recommend recheck the area in 6-8 weeks to make sure the lymph node is resolving. It is firm but not rockhard which is reassuring.  Reports had a rash on her scalp area. Given a prescription for triamcinolone ointment. She said it does help but usually takes a couple weeks to really notice complete improvement. She feels like she is getting a very similar rash on her mid back. It's dry and itchy. She has been using an ointment on it and feels it helps some but hasn't been getting a great response. Plus she doesn't like the greasy component of the appointment. It consists and her close.  Review of Systems     Objective:   Physical Exam  Constitutional: She is oriented to person, place, and time. She appears well-developed and well-nourished.  HENT:  Head: Normocephalic and atraumatic.  Neck: Neck supple.  Cardiovascular: Normal rate, regular rhythm and normal heart sounds.   Pulmonary/Chest: Effort normal and breath sounds normal.  Lymphadenopathy:    She has no cervical adenopathy.  Neurological: She is alert and oriented to person, place, and time.  Skin: Skin is warm and dry.  She has an dry erythematous papule with excoriations over her left mid back.  Psychiatric: She has a normal mood and affect. Her behavior is normal.          Assessment & Plan:  Hypertension-well-controlled. Refill sent to pharmacy. Followup in 6 months.  Lymphadenopathy- resolved. I'm unable to palpate a lymph node on her neck.  Eczema-will change to stronger potency steroid and will switch to cream to avoid the greasy and acid ointment. Call if not responding  appropriately.  Eczema - change to higher potency topical steroid.

## 2013-10-23 ENCOUNTER — Telehealth: Payer: Self-pay | Admitting: *Deleted

## 2013-10-23 DIAGNOSIS — E049 Nontoxic goiter, unspecified: Secondary | ICD-10-CM

## 2013-10-24 NOTE — Telephone Encounter (Signed)
Order placed.Sara Callahan  

## 2013-10-28 ENCOUNTER — Ambulatory Visit (INDEPENDENT_AMBULATORY_CARE_PROVIDER_SITE_OTHER): Payer: 59

## 2013-10-28 ENCOUNTER — Encounter: Payer: Self-pay | Admitting: Family Medicine

## 2013-10-28 DIAGNOSIS — E049 Nontoxic goiter, unspecified: Secondary | ICD-10-CM

## 2013-10-28 DIAGNOSIS — E042 Nontoxic multinodular goiter: Secondary | ICD-10-CM | POA: Insufficient documentation

## 2013-10-29 ENCOUNTER — Other Ambulatory Visit: Payer: Self-pay | Admitting: Family Medicine

## 2013-10-29 DIAGNOSIS — E041 Nontoxic single thyroid nodule: Secondary | ICD-10-CM

## 2013-11-12 ENCOUNTER — Ambulatory Visit
Admission: RE | Admit: 2013-11-12 | Discharge: 2013-11-12 | Disposition: A | Discharge status: Home / Self Care | Payer: 59 | Source: Ambulatory Visit | Attending: Family Medicine | Admitting: Family Medicine

## 2013-11-12 ENCOUNTER — Other Ambulatory Visit (HOSPITAL_COMMUNITY)
Admission: RE | Admit: 2013-11-12 | Discharge: 2013-11-12 | Disposition: A | Discharge status: Home / Self Care | Payer: 59 | Source: Ambulatory Visit | Attending: Interventional Radiology | Admitting: Interventional Radiology

## 2013-11-12 DIAGNOSIS — E041 Nontoxic single thyroid nodule: Secondary | ICD-10-CM

## 2013-11-27 ENCOUNTER — Telehealth: Payer: Self-pay | Admitting: *Deleted

## 2013-11-27 DIAGNOSIS — Z1231 Encounter for screening mammogram for malignant neoplasm of breast: Secondary | ICD-10-CM
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
## 2013-11-28 NOTE — Telephone Encounter (Signed)
Ok for [employer]

## 2013-12-11 ENCOUNTER — Other Ambulatory Visit: Payer: Self-pay | Admitting: Family Medicine

## 2013-12-26 ENCOUNTER — Telehealth: Payer: Self-pay | Admitting: *Deleted

## 2013-12-26 NOTE — Telephone Encounter (Signed)
Pt called and stated that she has left several vm w/regards to a

## 2013-12-29 ENCOUNTER — Other Ambulatory Visit: Payer: Self-pay | Admitting: *Deleted

## 2013-12-29 DIAGNOSIS — N644 Mastodynia: Secondary | ICD-10-CM

## 2013-12-30 NOTE — Telephone Encounter (Signed)
Order faxed.Sara Callahan

## 2014-01-06 ENCOUNTER — Other Ambulatory Visit: Payer: Self-pay | Admitting: Family Medicine

## 2014-01-06 DIAGNOSIS — N6310 Unspecified lump in the right breast, unspecified quadrant: Secondary | ICD-10-CM

## 2014-01-07 ENCOUNTER — Other Ambulatory Visit: Payer: Self-pay | Admitting: *Deleted

## 2014-01-07 DIAGNOSIS — N63 Unspecified lump in unspecified breast: Secondary | ICD-10-CM

## 2014-01-12 ENCOUNTER — Other Ambulatory Visit: Payer: Self-pay | Admitting: *Deleted

## 2014-01-12 MED ORDER — LOSARTAN POTASSIUM 50 MG PO TABS: 50.0000 mg | ORAL_TABLET | Freq: Every day | ORAL | Status: DC

## 2014-01-15 ENCOUNTER — Encounter: Payer: Self-pay | Admitting: Family Medicine

## 2014-02-11 ENCOUNTER — Encounter: Payer: Self-pay | Admitting: Family Medicine

## 2014-03-20 ENCOUNTER — Other Ambulatory Visit: Payer: Self-pay | Admitting: Family Medicine

## 2014-03-26 ENCOUNTER — Telehealth: Payer: Self-pay | Admitting: *Deleted

## 2014-03-26 DIAGNOSIS — N63 Unspecified lump in unspecified breast: Secondary | ICD-10-CM

## 2014-03-26 NOTE — Telephone Encounter (Signed)
Orders placed and faxed.Briannah Lona Lynetta  

## 2014-04-21 ENCOUNTER — Encounter: Payer: Self-pay | Admitting: Family Medicine

## 2014-04-28 ENCOUNTER — Other Ambulatory Visit: Payer: Self-pay | Admitting: Family Medicine

## 2014-05-13 ENCOUNTER — Other Ambulatory Visit: Payer: Self-pay | Admitting: *Deleted

## 2014-05-13 MED ORDER — DULOXETINE HCL 60 MG PO CPEP: ORAL_CAPSULE | ORAL | Status: DC

## 2014-05-13 NOTE — Telephone Encounter (Signed)
Pt called and stated that she would like refill of cymbalta sent to her pharmacy she is traveling to [place] and understands that no additional refills will be given w/o scheduling an appt 1st. Pt told that it is imperative that she f/u as soon as she can.Sara Callahan [place]

## 2014-06-22 ENCOUNTER — Telehealth: Payer: Self-pay | Admitting: Family Medicine

## 2014-06-22 ENCOUNTER — Ambulatory Visit (INDEPENDENT_AMBULATORY_CARE_PROVIDER_SITE_OTHER): Payer: 59 | Admitting: Family Medicine

## 2014-06-22 ENCOUNTER — Encounter: Payer: Self-pay | Admitting: Family Medicine

## 2014-06-22 VITALS — BP 126/78 | HR 98 | Temp 97.8°F | Ht 62.0 in | Wt 158.0 lb

## 2014-06-22 DIAGNOSIS — J0110 Acute frontal sinusitis, unspecified: Secondary | ICD-10-CM

## 2014-06-22 DIAGNOSIS — I10 Essential (primary) hypertension: Secondary | ICD-10-CM

## 2014-06-22 DIAGNOSIS — S2001XD Contusion of right breast, subsequent encounter: Secondary | ICD-10-CM

## 2014-06-22 MED ORDER — DULOXETINE HCL 60 MG PO CPEP: ORAL_CAPSULE | ORAL | Status: DC

## 2014-06-22 MED ORDER — AMOXICILLIN-POT CLAVULANATE 875-125 MG PO TABS: 1.0000 | ORAL_TABLET | Freq: Two times a day (BID) | ORAL | Status: DC

## 2014-06-22 MED ORDER — ALBUTEROL SULFATE HFA 108 (90 BASE) MCG/ACT IN AERS: 1.0000 | INHALATION_SPRAY | RESPIRATORY_TRACT | Status: DC | PRN

## 2014-06-22 MED ORDER — LOSARTAN POTASSIUM 50 MG PO TABS: 50.0000 mg | ORAL_TABLET | Freq: Every day | ORAL | Status: DC
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
## 2014-06-22 NOTE — Telephone Encounter (Signed)
Pt's order has been faxed to [phone].Sara Callahan

## 2014-06-22 NOTE — Telephone Encounter (Signed)
Ms. Dudenhoeffer is being seen as a followup at [facility] and for this reason the IMG code should be [id number] rt breast LTD per [facility] Scheduling

## 2014-07-27 ENCOUNTER — Telehealth: Payer: Self-pay

## 2014-07-27 NOTE — Telephone Encounter (Signed)
Sara Callahan has a CPE scheduled in January. She would like to come in the morning for fasting labs. I will order lipid, cmp and tsh. Would you like any other labs added?

## 2014-07-27 NOTE — Telephone Encounter (Signed)
Sounds perfect

## 2014-07-28 ENCOUNTER — Other Ambulatory Visit: Payer: Self-pay | Admitting: Family Medicine

## 2014-07-28 DIAGNOSIS — E785 Hyperlipidemia, unspecified: Secondary | ICD-10-CM

## 2014-07-28 DIAGNOSIS — Z0000 Encounter for general adult medical examination without abnormal findings: Secondary | ICD-10-CM

## 2014-07-29 LAB — LIPID PANEL
CHOLESTEROL: 241 mg/dL — AB (ref 0–200)
HDL: 62 mg/dL (ref >39)
LDL Cholesterol: 150 mg/dL — ABNORMAL HIGH (ref 0–99)
TRIGLYCERIDES: 147 mg/dL (ref <150)
Total CHOL/HDL Ratio: 3.9 Ratio
VLDL: 29 mg/dL (ref 0–40)

## 2014-07-29 LAB — COMPLETE METABOLIC PANEL WITH GFR
ALT: 19 U/L (ref 0–35)
AST: 16 U/L (ref 0–37)
Albumin: 4 g/dL (ref 3.5–5.2)
Alkaline Phosphatase: 90 U/L (ref 39–117)
BUN: 12 mg/dL (ref 6–23)
CALCIUM: 9.1 mg/dL (ref 8.4–10.5)
CO2: 29 mEq/L (ref 19–32)
CREATININE: 0.61 mg/dL (ref 0.50–1.10)
Chloride: 105 mEq/L (ref 96–112)
GFR, Est Non African American: >89 mL/min
GLUCOSE: 101 mg/dL — AB (ref 70–99)
Potassium: 4.3 mEq/L (ref 3.5–5.3)
Sodium: 141 mEq/L (ref 135–145)
Total Bilirubin: 0.5 mg/dL (ref 0.2–1.2)
Total Protein: 6.3 g/dL (ref 6.0–8.3)

## 2014-07-30 LAB — TSH: TSH: 0.805 u[IU]/mL (ref 0.350–4.500)

## 2014-08-04 ENCOUNTER — Encounter: Payer: Self-pay | Admitting: Family Medicine

## 2014-08-05 ENCOUNTER — Encounter: Payer: Self-pay | Admitting: Family Medicine

## 2014-08-05 ENCOUNTER — Ambulatory Visit (INDEPENDENT_AMBULATORY_CARE_PROVIDER_SITE_OTHER): Payer: 59 | Admitting: Family Medicine

## 2014-08-05 VITALS — BP 138/84 | HR 107 | Ht 62.0 in | Wt 164.0 lb

## 2014-08-05 DIAGNOSIS — Z0000 Encounter for general adult medical examination without abnormal findings: Secondary | ICD-10-CM

## 2014-08-05 DIAGNOSIS — Z01419 Encounter for gynecological examination (general) (routine) without abnormal findings: Secondary | ICD-10-CM

## 2014-08-05 MED ORDER — ATORVASTATIN CALCIUM 40 MG PO TABS
40.0000 mg | ORAL_TABLET | Freq: Every day | ORAL | Status: DC
Start: 2014-08-05 — End: 2015-08-09

## 2014-08-05 NOTE — Progress Notes (Signed)
  Subjective:     Sara Callahan is a 54 y.o. female and is here for a comprehensive physical exam. The patient reports no problems.  History   Social History  . Marital Status: Married    Spouse Name: Sara Callahan    Number of Children: 2  . Years of Education: N/A   Occupational History  .      [employer]   Social History Main Topics  . Smoking status: Never Smoker   . Smokeless tobacco: Never Used  . Alcohol Use: Yes     Comment: "once or twice a year"  . Drug Use: No  . Sexual Activity:    Partners: Male     Comment: Sara Callahan, 12 yrs educations, married, 3 kids.   Other Topics Concern  . Not on file   Social History Narrative   +  regular exercise. Takes her calcium supplement.    Health Maintenance  Topic Date Due  . HIV Screening  10/17/1973  . INFLUENZA VACCINE  03/01/2015  . COLONOSCOPY  07/27/2015  . MAMMOGRAM  01/08/2016  . TETANUS/TDAP  09/30/2019    The following portions of the patient's history were reviewed and updated as appropriate: allergies, current medications, past family history, past medical history, past social history, past surgical history and problem list.  Review of Systems A comprehensive review of systems was negative.   Objective:    BP 138/84 mmHg  Pulse 107  Ht 5\' 2"  (1.575 m)  Wt 164 lb (74.39 kg)  BMI 29.99 kg/m2  SpO2 96% General appearance: alert, cooperative and appears stated age Head: Normocephalic, without obvious abnormality, atraumatic Eyes: conj clear, EOMI, PEERLA Ears: normal TM's and external ear canals both ears Nose: Nares normal. Septum midline. Mucosa normal. No drainage or sinus tenderness. Throat: lips, mucosa, and tongue normal; teeth and gums normal Neck: no adenopathy, no carotid bruit, no JVD, supple, symmetrical, trachea midline and thyroid not enlarged, symmetric, no tenderness/mass/nodules Back: symmetric, no curvature. ROM normal. No CVA tenderness. Lungs: clear to  auscultation bilaterally Heart: regular rate and rhythm, S1, S2 normal, no murmur, click, rub or gallop Abdomen: soft, non-tender; bowel sounds normal; no masses,  no organomegaly Pelvic: deferred Extremities: extremities normal, atraumatic, no cyanosis or edema Pulses: 2+ and symmetric Skin: Skin color, texture, turgor normal. No rashes or lesions Lymph nodes: Cervical, supraclavicular, and axillary nodes normal. Neurologic: Alert and oriented X 3, normal strength and tone. Normal symmetric reflexes. Normal coordination and gait    Assessment:    Healthy female exam.      Plan:     See After Visit Summary for Counseling Recommendations   complete physical examination Keep up a regular exercise program and make sure you are eating a healthy diet Try to eat 4 servings of dairy a day, or if you are lactose intolerant take a calcium with vitamin D daily.  Your vaccines are up to date.  Discussed shingles vaccine. - check with the insurance.

## 2014-08-05 NOTE — Patient Instructions (Addendum)
Black cohash, evening primrose oil. Increasing soy in the diet.   Keep up a regular exercise program and make sure you are eating a healthy diet Try to eat 4 servings of dairy a day, or if you are lactose intolerant take a calcium with vitamin D daily.  Your vaccines are up to date.

## 2014-11-22 ENCOUNTER — Emergency Department
Admission: EM | Admit: 2014-11-22 | Discharge: 2014-11-22 | Disposition: A | Discharge status: Home / Self Care | Payer: 59 | Source: Home / Self Care | Attending: Family Medicine | Admitting: Family Medicine

## 2014-11-22 ENCOUNTER — Emergency Department (INDEPENDENT_AMBULATORY_CARE_PROVIDER_SITE_OTHER): Payer: 59

## 2014-11-22 DIAGNOSIS — M25561 Pain in right knee: Secondary | ICD-10-CM | POA: Diagnosis not present

## 2014-11-22 DIAGNOSIS — M7050 Other bursitis of knee, unspecified knee: Secondary | ICD-10-CM

## 2014-11-22 DIAGNOSIS — M7150 Other bursitis, not elsewhere classified, unspecified site: Secondary | ICD-10-CM

## 2014-11-22 NOTE — Discharge Instructions (Signed)
Apply ice pack for 20 to 30 minutes, every 3 to 4 hours.  Continue until pain decreases.  May take Ibuprofen 200mg , 4 tabs every 8 hours with food.  Begin stretching exercises as tolerated.  May wear knee brace if helpful.

## 2014-11-22 NOTE — ED Provider Notes (Signed)
CSN: [id number]     Arrival date & time 11/22/14  1101 History   First MD Initiated Contact with Patient 11/22/14 1132     Chief Complaint  Patient presents with  . Knee Pain    Right       HPI Comments: Patient reports that she underwent surgery on her right peroneal tendon on September 08, 2014, and has been treated with physical therapy.  Three days ago while doing leg presses using increased weight, she developed pain in the medial aspect of her right knee.  The pain has persisted and is worse standing and going up/down stairs.  She has a sensation of locking and/or giving way.  She notes that she has an appointment with her orthopedist next week.  Patient is a 54 y.o. female presenting with knee pain. The history is provided by the patient.  Knee Pain Location:  Knee Time since incident:  3 days Injury: no   Knee location:  R knee Pain details:    Quality:  Aching   Radiates to:  Does not radiate   Severity:  Mild   Onset quality:  Sudden   Duration:  3 days   Timing:  Constant   Progression:  Unchanged Chronicity:  New Prior injury to area:  No Relieved by:  Nothing Worsened by:  Extension and flexion Ineffective treatments:  NSAIDs Associated symptoms: decreased ROM and stiffness   Associated symptoms: no back pain, no fever, no muscle weakness, no numbness, no swelling and no tingling     Past Medical History  Diagnosis Date  . Hyperlipidemia   . Hypertension   . 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 Cesarean section  48yrs ago, 18 yrs ago    x2  . Abdominal hysterectomy  14 yrs ago  . Knee surgery  8 yrs ago    Left   . Foot surgery  1.5 yrs ago  . Lung tumor removed  5 yrs ago    LT spindle  . Lipoma resection  Aug2012    Left axilla   Family History  Problem Relation Age of Onset  . Depression      cousins  . Diabetes      grandparents  . Heart attack      grandparents  .  Hypertension      grandparents  . Osteoporosis Mother   . Throat cancer Paternal Uncle   . Uterine cancer Mother    History  Substance Use Topics  . Smoking status: Never Smoker   . Smokeless tobacco: Never Used  . Alcohol Use: Yes     Comment: "once or twice a year"   OB History    No data available     Review of Systems  Constitutional: Negative for fever.  Musculoskeletal: Positive for stiffness. Negative for back pain.  All other systems reviewed and are negative.   Allergies  Sulfa antibiotics; Decongestant; Iodinated diagnostic agents; Lisinopril; Procaine hcl; Propoxyphene n-acetaminophen; and Sulfonamide derivatives  Home Medications   Prior to Admission medications   Medication Sig Start Date End Date Taking? Authorizing Provider  albuterol (PROVENTIL HFA;VENTOLIN HFA) 108 (90 BASE) MCG/ACT inhaler Inhale 1-2 puffs into the lungs every 4 (four) hours as needed for wheezing or shortness of breath. 06/22/14  Yes Hali Marry, MD  atorvastatin (LIPITOR) 40 MG tablet Take 1 tablet (40 mg total) by mouth daily. 08/05/14  Yes Hali Marry, MD  calcium carbonate 200 MG capsule Take 250 mg by mouth 2 (two) times daily with a meal.     Yes Historical Provider, MD  DULoxetine (CYMBALTA) 60 MG capsule TAKE ONE CAPSULE BY MOUTH ONE TIME DAILY 06/22/14  Yes Hali Marry, MD  fluticasone (FLONASE) 50 MCG/ACT nasal spray PLACE 2 SPRAYS INTO THE NOSE DAILY. Patient taking differently: as needed. PLACE 2 SPRAYS INTO THE NOSE DAILY. 07/09/13  Yes Hali Marry, MD  losartan (COZAAR) 50 MG tablet Take 1 tablet (50 mg total) by mouth daily. 06/22/14  Yes Hali Marry, MD  Multiple Vitamin (MULTIVITAMIN) tablet Take 1 tablet by mouth daily.     Yes Historical Provider, MD   BP 136/95 mmHg  Pulse 92  Temp(Src) 97.7 F (36.5 C) (Oral)  Ht 5\' 2"  (1.575 m)  Wt 164 lb (74.39 kg)  BMI 29.99 kg/m2  SpO2 98% Physical Exam  Constitutional: She is  oriented to person, place, and time. She appears well-developed and well-nourished. No distress.  HENT:  Head: Atraumatic.  Eyes: Pupils are equal, round, and reactive to light.  Neck: Normal range of motion.  Cardiovascular: Normal heart sounds.   Pulmonary/Chest: Breath sounds normal.  Abdominal: There is no tenderness.  Musculoskeletal: She exhibits tenderness. She exhibits no edema.       Right knee: She exhibits decreased range of motion. She exhibits no swelling, no effusion, no ecchymosis, no deformity, no erythema, normal alignment and normal meniscus. No MCL, no LCL and no patellar tendon tenderness noted.       Legs: Right knee:  No effusion, erythema, or warmth.  Knee stable, negative drawer test.  McMurray test negative.   Tenderness medially over the pes anserine bursa.  Neurological: She is alert and oriented to person, place, and time.  Skin: Skin is warm and dry.  Nursing note and vitals reviewed.   ED Course  Procedures none   Imaging Review Dg Knee Complete 4 Views Right  11/22/2014   CLINICAL DATA:  Right knee pain for 3 days, no trauma  EXAM: RIGHT KNEE - COMPLETE 4+ VIEW  COMPARISON:  None.  FINDINGS: There is no evidence of fracture, dislocation, or joint effusion. There is no evidence of arthropathy or other focal bone abnormality. Soft tissues are unremarkable. Bones are subjectively osteopenic. Minimal tibial spine spurring and minimal tricompartmental degenerative change.  IMPRESSION: No acute osseous abnormality. Minimal tricompartmental degenerative change.   Electronically Signed   By: Conchita Paris M.D.   On: 11/22/2014 13:01     MDM   1. Pes anserine bursitis     Apply ice pack for 20 to 30 minutes, every 3 to 4 hours.  Continue until pain decreases.  May take Ibuprofen 200mg , 4 tabs every 8 hours with food.  Begin stretching exercises as tolerated.  May wear knee brace if helpful. Followup with orthopedist as scheduled.    Kandra Nicolas,  MD 11/26/14 2113

## 2014-11-22 NOTE — ED Notes (Signed)
Patient complains of right knee pain, she states that she is currently in physical therapy and that the pain started approx 1-2 weeks ago after the therapist started increasing weights.

## 2015-01-01 ENCOUNTER — Telehealth: Payer: Self-pay | Admitting: *Deleted

## 2015-01-01 DIAGNOSIS — N6021 Fibroadenosis of right breast: Secondary | ICD-10-CM

## 2015-01-01 NOTE — Telephone Encounter (Signed)
Right breast ultrasound ordered for [facility].

## 2015-02-04 LAB — HM MAMMOGRAPHY

## 2015-02-05 ENCOUNTER — Other Ambulatory Visit: Payer: Self-pay | Admitting: Family Medicine

## 2015-02-24 ENCOUNTER — Encounter: Payer: Self-pay | Admitting: Family Medicine

## 2015-03-29 ENCOUNTER — Telehealth: Payer: Self-pay | Admitting: Family Medicine

## 2015-03-29 MED ORDER — ALPRAZOLAM 0.25 MG PO TABS: 0.2500 mg | ORAL_TABLET | Freq: Every evening | ORAL | Status: DC | PRN

## 2015-03-29 NOTE — Telephone Encounter (Signed)
Called and spoke with son Vonna Kotyk and Jerriyah. Husband found dead on 11-26-2022 after laying down to take a nap.  She is still in [place]. will be flying home for funeral. Hasn't slept in days.  Will send over short term med for sleep and anxiety.  Can fax over FMLA once get home.  Gave our regards and to call if needs anything.

## 2015-05-06 ENCOUNTER — Telehealth: Payer: Self-pay

## 2015-05-06 NOTE — Telephone Encounter (Signed)
Patient left a message stating the FMLA needs to read Depression for the reason she is out. It shouldn't be due to death of her husband, otherwise she will not get paid. I will call her to have the company send another form.

## 2015-05-06 NOTE — Telephone Encounter (Signed)
OK, i will be happy to correct his.

## 2015-05-18 ENCOUNTER — Telehealth: Payer: Self-pay

## 2015-05-18 NOTE — Telephone Encounter (Signed)
OK, no problems. I won't be able to fax office notes since I haven't seen her.  She might need to make an apointment for documentation purposes but I will see what the paperwork says.

## 2015-05-18 NOTE — Telephone Encounter (Addendum)
Sara Callahan states she wants to return back to work on Oct. 31 st. She states the company will need 5 days notice before returning to work and a return to work form filled out. They will fax the form and they need office notes faxed along with forms. Please advise.

## 2015-05-21 DIAGNOSIS — Z9889 Other specified postprocedural states: Secondary | ICD-10-CM | POA: Insufficient documentation

## 2015-05-27 ENCOUNTER — Encounter: Payer: Self-pay | Admitting: Family Medicine

## 2015-05-27 ENCOUNTER — Ambulatory Visit (INDEPENDENT_AMBULATORY_CARE_PROVIDER_SITE_OTHER): Payer: 59 | Admitting: Family Medicine

## 2015-05-27 VITALS — BP 146/88 | HR 93 | Temp 98.6°F | Resp 18 | Wt 154.8 lb

## 2015-05-27 DIAGNOSIS — F4321 Adjustment disorder with depressed mood: Secondary | ICD-10-CM | POA: Diagnosis not present

## 2015-05-27 DIAGNOSIS — F32A Depression, unspecified: Secondary | ICD-10-CM

## 2015-05-27 DIAGNOSIS — I10 Essential (primary) hypertension: Secondary | ICD-10-CM | POA: Diagnosis not present

## 2015-05-27 DIAGNOSIS — F329 Major depressive disorder, single episode, unspecified: Secondary | ICD-10-CM

## 2015-05-27 NOTE — Progress Notes (Signed)
   Subjective:    Patient ID: Sara Callahan, female    DOB: 01-14-1961, 54 y.o.   MRN: [id number]  HPI Here to follow up for acute grief reaction from the recent adn sudden death of her husband. .  Set to return to work on October 31st.   Hs is sleeping some.  Not involved in counseling right now. Though she is interested as she is getting into the Holidays.  Using her xanax sparingly.  She does complain of little interest or pleasure doing things more than half the days as well as feeling down half the days. She also claims of fatigue and low energy as well as difficulty concentrating nearly every day. She denies any thoughts of wanting to harm herself.  Hypertension- Pt denies chest pain, SOB, dizziness, or heart palpitations.  Taking meds as directed w/o problems.  Denies medication side effects.     Review of Systems     Objective:   Physical Exam  Constitutional: She is oriented to person, place, and time. She appears well-developed and well-nourished.  HENT:  Head: Normocephalic and atraumatic.  Cardiovascular: Normal rate, regular rhythm and normal heart sounds.   Pulmonary/Chest: Effort normal and breath sounds normal.  Neurological: She is alert and oriented to person, place, and time.  Skin: Skin is warm and dry.  Psychiatric: She has a normal mood and affect. Her behavior is normal.          Assessment & Plan:  Grief reaction/acute depression - on cymbalta and doing well.  Will refer for grief counseling.  PHQ- 9 score of 13 today. Rates her sxs as very difficulty.  Even though she is struggling do think she is okay to go back to work on Monday. And she is actually hoping that will help her life feel a little bit more normal since the recent passing of her husband.  HTN - BP up a little today. Will recheck in 6 weeks at her follow up.  She is taking her meds regularly.    Needs a letter stating date of her CPE this year and copy of her bloodwork.

## 2015-06-06 ENCOUNTER — Other Ambulatory Visit: Payer: Self-pay | Admitting: Family Medicine

## 2015-07-08 ENCOUNTER — Ambulatory Visit: Payer: 59 | Admitting: Family Medicine

## 2015-07-16 ENCOUNTER — Ambulatory Visit: Payer: 59 | Admitting: Family Medicine

## 2015-08-08 ENCOUNTER — Other Ambulatory Visit: Payer: Self-pay | Admitting: Family Medicine

## 2015-08-09 ENCOUNTER — Other Ambulatory Visit: Payer: Self-pay | Admitting: Family Medicine

## 2015-08-16 ENCOUNTER — Other Ambulatory Visit: Payer: Self-pay | Admitting: Family Medicine

## 2015-08-16 DIAGNOSIS — N6489 Other specified disorders of breast: Secondary | ICD-10-CM

## 2015-09-09 LAB — LIPID PANEL
Cholesterol: 175 mg/dL (ref 0–200)
HDL: 66 mg/dL (ref 35–70)
LDL CALC: 95 mg/dL
Triglycerides: 72 mg/dL (ref 40–160)

## 2015-09-09 LAB — BASIC METABOLIC PANEL: GLUCOSE: 98 mg/dL

## 2015-09-12 ENCOUNTER — Other Ambulatory Visit: Payer: Self-pay | Admitting: Family Medicine

## 2015-09-27 ENCOUNTER — Ambulatory Visit (INDEPENDENT_AMBULATORY_CARE_PROVIDER_SITE_OTHER): Payer: Managed Care, Other (non HMO) | Admitting: Family Medicine

## 2015-09-27 ENCOUNTER — Encounter: Payer: Self-pay | Admitting: Family Medicine

## 2015-09-27 VITALS — BP 126/85 | HR 95 | Wt 156.0 lb

## 2015-09-27 DIAGNOSIS — Z114 Encounter for screening for human immunodeficiency virus [HIV]: Secondary | ICD-10-CM

## 2015-09-27 DIAGNOSIS — F4321 Adjustment disorder with depressed mood: Secondary | ICD-10-CM

## 2015-09-27 DIAGNOSIS — R319 Hematuria, unspecified: Secondary | ICD-10-CM

## 2015-09-27 DIAGNOSIS — E785 Hyperlipidemia, unspecified: Secondary | ICD-10-CM

## 2015-09-27 DIAGNOSIS — R350 Frequency of micturition: Secondary | ICD-10-CM | POA: Diagnosis not present

## 2015-09-27 DIAGNOSIS — I10 Essential (primary) hypertension: Secondary | ICD-10-CM

## 2015-09-27 DIAGNOSIS — E042 Nontoxic multinodular goiter: Secondary | ICD-10-CM

## 2015-09-27 LAB — POCT URINALYSIS DIPSTICK
BILIRUBIN UA: NEGATIVE
GLUCOSE UA: NEGATIVE
KETONES UA: NEGATIVE
Nitrite, UA: NEGATIVE
Protein, UA: NEGATIVE
SPEC GRAV UA: 1.01
Urobilinogen, UA: 0.2
pH, UA: 5.5

## 2015-09-27 MED ORDER — LOSARTAN POTASSIUM 50 MG PO TABS: ORAL_TABLET | ORAL | Status: DC

## 2015-09-27 MED ORDER — ATORVASTATIN CALCIUM 40 MG PO TABS: 40.0000 mg | ORAL_TABLET | Freq: Every day | ORAL | Status: DC

## 2015-09-27 NOTE — Progress Notes (Addendum)
   Subjective:    Patient ID: Sara Callahan, female    DOB: October 15, 1960, 55 y.o.   MRN: [id number]  HPI Hypertension- Pt denies chest pain, SOB, dizziness, or heart palpitations.  Taking meds as directed w/o problems.  Denies medication side effects.    She is due for her colonoscopy for April 21st at 9:30.    Urinary frequency and urgency x severel months.  - no gross blood in the urine. Has to to about every 30 min-1 hour. No pelvic pain or dysuria. Has had back pain but this is not new.   Grief -  Still grieving over eht loss of her husband.  Has been to hospice counselor a couple of times and group therapy once. It has been helpful but wasn't able to go in Feb bc of helping to pick up her grandson from school.  Hyperlipidemia - tolerating statin well with no S.E.   Review of Systems     Objective:   Physical Exam  Constitutional: She is oriented to person, place, and time. She appears well-developed and well-nourished.  HENT:  Head: Normocephalic and atraumatic.  Cardiovascular: Normal rate, regular rhythm and normal heart sounds.   Pulmonary/Chest: Effort normal and breath sounds normal.  Neurological: She is alert and oriented to person, place, and time.  Skin: Skin is warm and dry.  Psychiatric: She has a normal mood and affect. Her behavior is normal.          Assessment & Plan:  HTN - well controlled. F/U in 6 months. Due for CMP  Hyperlipidemia - recheck lipids.    Grief reaction - discussed options. I think getting back into counseling would be the best thing for her. continue Cymbalta. PHQ 9 score of 14 today. She rates her symptoms as very difficult.   Urinary frequency - UA pos for leuk and blood. Will send for cx.  If neg consider tx for OAB. Discussed antispasmodics as option and potential S.E. She will think about it. Discussed Kegels as well.

## 2015-09-29 ENCOUNTER — Other Ambulatory Visit: Payer: Self-pay | Admitting: Family Medicine

## 2015-09-29 DIAGNOSIS — R350 Frequency of micturition: Secondary | ICD-10-CM

## 2015-09-29 LAB — URINE CULTURE: Colony Count: >=100000

## 2015-09-30 ENCOUNTER — Encounter: Payer: Self-pay | Admitting: Family Medicine

## 2015-10-01 LAB — COMPLETE METABOLIC PANEL WITH GFR
ALBUMIN: 4.2 g/dL (ref 3.6–5.1)
ALK PHOS: 97 U/L (ref 33–130)
ALT: 38 U/L — AB (ref 6–29)
AST: 17 U/L (ref 10–35)
BUN: 11 mg/dL (ref 7–25)
CALCIUM: 9.4 mg/dL (ref 8.6–10.4)
CO2: 29 mmol/L (ref 20–31)
CREATININE: 0.61 mg/dL (ref 0.50–1.05)
Chloride: 104 mmol/L (ref 98–110)
GFR, Est African American: >89 mL/min (ref >=60)
GFR, Est Non African American: >89 mL/min (ref >=60)
GLUCOSE: 103 mg/dL — AB (ref 65–99)
Potassium: 4.5 mmol/L (ref 3.5–5.3)
SODIUM: 142 mmol/L (ref 135–146)
TOTAL PROTEIN: 6.4 g/dL (ref 6.1–8.1)
Total Bilirubin: 0.6 mg/dL (ref 0.2–1.2)

## 2015-10-01 LAB — HIV ANTIBODY (ROUTINE TESTING W REFLEX): HIV 1&2 Ab, 4th Generation: NONREACTIVE

## 2015-10-01 LAB — TSH: TSH: 0.98 m[IU]/L

## 2015-10-02 LAB — URINE CULTURE
Colony Count: NO GROWTH
Organism ID, Bacteria: NO GROWTH

## 2015-10-04 ENCOUNTER — Other Ambulatory Visit: Payer: Self-pay | Admitting: Emergency Medicine

## 2015-10-04 DIAGNOSIS — R319 Hematuria, unspecified: Secondary | ICD-10-CM

## 2015-10-27 ENCOUNTER — Telehealth: Payer: Self-pay | Admitting: Family Medicine

## 2015-10-27 DIAGNOSIS — R9431 Abnormal electrocardiogram [ECG] [EKG]: Secondary | ICD-10-CM

## 2015-10-27 NOTE — Telephone Encounter (Signed)
Referral placed. We can try to get more info to help the cardiologist.

## 2015-10-27 NOTE — Telephone Encounter (Signed)
Pt states she called office this am and was complaining of chest discomfort and jaw pain. Pt was instructed to go to ED. Pt went to [facility] and had an abnormal EKG. They advsied Pt to contact her PCP and get a cardiology referral. Will route to PCP to see if Pt needs to be seen before referral can be placed.

## 2015-11-17 ENCOUNTER — Ambulatory Visit (INDEPENDENT_AMBULATORY_CARE_PROVIDER_SITE_OTHER): Payer: Managed Care, Other (non HMO) | Admitting: Cardiology

## 2015-11-17 ENCOUNTER — Encounter: Payer: Self-pay | Admitting: Cardiology

## 2015-11-17 VITALS — BP 123/86 | HR 88 | Ht 62.0 in | Wt 150.1 lb

## 2015-11-17 DIAGNOSIS — R079 Chest pain, unspecified: Secondary | ICD-10-CM | POA: Insufficient documentation

## 2015-11-17 DIAGNOSIS — R072 Precordial pain: Secondary | ICD-10-CM | POA: Diagnosis not present

## 2015-11-17 DIAGNOSIS — I10 Essential (primary) hypertension: Secondary | ICD-10-CM | POA: Diagnosis not present

## 2015-11-17 DIAGNOSIS — R9431 Abnormal electrocardiogram [ECG] [EKG]: Secondary | ICD-10-CM | POA: Diagnosis not present

## 2015-11-17 DIAGNOSIS — E785 Hyperlipidemia, unspecified: Secondary | ICD-10-CM | POA: Diagnosis not present

## 2015-11-17 NOTE — Progress Notes (Signed)
HPI: 55 year old female for evaluation of abnormal electrocardiogram. Patient recently seen in [place] for jaw pain. Electrocardiogram reportedly abnormal but results are not available. Troponin T normal. Chest x-ray negative. Cardiology asked to evaluate. Patient states that on the day of evaluation she had had jaw pain for 3 days. She was found to have an abscess and was treated with antibiotics with some improvement. She also states she occasionally has chest heaviness. This is not related to activities. It is not pleuritic, positional or related to food. Lasts several minutes and resolves. No radiation or associated symptoms. She has dyspnea with more extreme activities but not routine activities. There is no orthopnea, PND, pedal edema, syncope or exertional chest pain.   Current Outpatient Prescriptions  Medication Sig Dispense Refill  . albuterol (PROVENTIL HFA;VENTOLIN HFA) 108 (90 BASE) MCG/ACT inhaler Inhale 1-2 puffs into the lungs every 4 (four) hours as needed for wheezing or shortness of breath. 1 Inhaler 2  . atorvastatin (LIPITOR) 40 MG tablet Take 1 tablet (40 mg total) by mouth daily at 6 PM. 90 tablet 3  . DULoxetine (CYMBALTA) 60 MG capsule TAKE ONE CAPSULE BY MOUTH ONE TIME DAILY 90 capsule 3  . fluticasone (FLONASE) 50 MCG/ACT nasal spray PLACE 2 SPRAYS INTO THE NOSE DAILY. (Patient taking differently: as needed. PLACE 2 SPRAYS INTO THE NOSE DAILY.) 16 g 11  . losartan (COZAAR) 50 MG tablet TAKE 1 TABLET (50 MG TOTAL) BY MOUTH DAILY. 90 tablet 1   No current facility-administered medications for this visit.    Allergies  Allergen Reactions  . Sulfa Antibiotics Anaphylaxis    Stopped breathing  . Decongestant [Pseudoephedrine Hcl Er]     Unknown reaction  . Iodinated Diagnostic Agents Swelling  . Lisinopril     REACTION: Cough  . Procaine Hcl     REACTION: palpitations.  . Propoxyphene N-Acetaminophen   . Sulfonamide Derivatives     REACTION: Sweeling and  rash     Past Medical History  Diagnosis Date  . Hyperlipidemia   . Hypertension   . Malignant tumor, spindle cell type ([place])     left lung / pleura  . Goiter     Past Surgical History  Procedure Laterality Date  . Cesarean section  50yrs ago, 18 yrs ago    x2  . Abdominal hysterectomy  14 yrs ago  . Knee surgery  8 yrs ago    Left   . Foot surgery  1.5 yrs ago  . Lung tumor removed  5 yrs ago    LT spindle  . Lipoma resection  Aug2012    Left axilla  . Dg c-arm 2 view right ankle ([facility] hx)      Social History   Social History  . Marital Status: Widowed    Spouse Name: Shanon Brow  . Number of Children: 3  . Years of Education: N/A   Occupational History  .      [employer]   Social History Main Topics  . Smoking status: Never Smoker   . Smokeless tobacco: Never Used  . Alcohol Use: Yes     Comment: "once or twice a year"  . Drug Use: No  . Sexual Activity:    Partners: Male     Comment: [employer] [place], 12 yrs educations, married, 3 kids.   Other Topics Concern  . Not on file   Social History Narrative   +  regular exercise. Takes her calcium supplement.  Family History  Problem Relation Age of Onset  . Depression      cousins  . Diabetes      grandparents  . Heart attack      grandparents  . Hypertension      grandparents  . Osteoporosis Mother   . Throat cancer Paternal Uncle   . Uterine cancer Mother     ROS: Right ankle pain but no fevers or chills, productive cough, hemoptysis, dysphasia, odynophagia, melena, hematochezia, dysuria, hematuria, rash, seizure activity, orthopnea, PND, pedal edema, claudication. Remaining systems are negative.  Physical Exam:   Blood pressure 123/86, pulse 88, height 5\' 2"  (1.575 m), weight 150 lb 1.9 oz (68.094 kg).  General:  Well developed/well nourished in NAD Skin warm/dry Patient not depressed No peripheral clubbing Back-normal HEENT-normal/normal eyelids Neck supple/normal  carotid upstroke bilaterally; no bruits; no JVD; no thyromegaly chest - CTA/ normal expansion CV - RRR/normal S1 and S2; no murmurs, rubs or gallops;  PMI nondisplaced Abdomen -NT/ND, no HSM, no mass, + bowel sounds, no bruit 2+ femoral pulses, no bruits Ext-no edema, chords, 2+ DP Neuro-grossly nonfocal  ECG Sinus rhythm at a rate of 83. Right axis deviation. No ST changes.

## 2015-11-17 NOTE — Patient Instructions (Signed)
Your physician has requested that you have an exercise tolerance test. For further information please visit [email or web address]. Please also follow instruction sheet, as given.   Your physician recommends that you schedule a follow-up appointment in: AS NEEDED PENDING TEST RESULTS  Exercise Stress Electrocardiogram An exercise stress electrocardiogram is a test to check how blood flows to your heart. It is done to find areas of poor blood flow. You will need to walk on a treadmill for this test. The electrocardiogram will record your heartbeat when you are at rest and when you are exercising. BEFORE THE PROCEDURE  Do not have drinks with caffeine or foods with caffeine for 24 hours before the test, or as told by your doctor. This includes coffee, tea (even decaf tea), sodas, chocolate, and cocoa.  Follow your doctor's instructions about eating and drinking before the test.  Ask your doctor what medicines you should or should not take before the test. Take your medicines with water unless told by your doctor not to.  If you use an inhaler, bring it with you to the test.  Bring a snack to eat after the test.  Do not  smoke for 4 hours before the test.  Do not put lotions, powders, creams, or oils on your chest before the test.  Wear comfortable shoes and clothing. PROCEDURE  You will have patches put on your chest. Small areas of your chest may need to be shaved. Wires will be connected to the patches.  Your heart rate will be watched while you are resting and while you are exercising.  You will walk on the treadmill. The treadmill will slowly get faster to raise your heart rate.  The test will take about 1-2 hours. AFTER THE PROCEDURE  Your heart rate and blood pressure will be watched after the test.  You may return to your normal diet, activities, and medicines or as told by your doctor.   This information is not intended to replace advice given to you by your health care  provider. Make sure you discuss any questions you have with your health care provider.   Document Released: 01/03/2008 Document Revised: 08/07/2014 Document Reviewed: 03/24/2013 Elsevier Interactive Patient Education [employer].

## 2015-11-17 NOTE — Assessment & Plan Note (Signed)
Blood pressure controlled. Continue present medications. 

## 2015-11-17 NOTE — Assessment & Plan Note (Signed)
We will obtain previous electrocardiogram from [facility]. Electrocardiogram today shows sinus rhythm with no ST changes and rightward axis.

## 2015-11-17 NOTE — Assessment & Plan Note (Signed)
Symptoms atypical. Plan exercise treadmill for risk stratification. 

## 2015-11-17 NOTE — Assessment & Plan Note (Signed)
Continue statin. 

## 2015-11-18 ENCOUNTER — Ambulatory Visit (INDEPENDENT_AMBULATORY_CARE_PROVIDER_SITE_OTHER): Payer: Managed Care, Other (non HMO)

## 2015-11-18 DIAGNOSIS — R072 Precordial pain: Secondary | ICD-10-CM | POA: Diagnosis not present

## 2015-11-18 LAB — EXERCISE TOLERANCE TEST
CHL CUP RESTING HR STRESS: 91 {beats}/min
CHL CUP STRESS STAGE 1 SPEED: 0 mph
CHL CUP STRESS STAGE 2 GRADE: 0 %
CHL CUP STRESS STAGE 2 SPEED: 1 mph
CHL CUP STRESS STAGE 3 GRADE: 0.1 %
CHL CUP STRESS STAGE 3 SPEED: 1 mph
CHL CUP STRESS STAGE 4 DBP: 96 mmHg
CHL CUP STRESS STAGE 5 SPEED: 2.5 mph
CHL CUP STRESS STAGE 6 SPEED: 3.4 mph
CHL CUP STRESS STAGE 7 DBP: 95 mmHg
CHL CUP STRESS STAGE 7 GRADE: 0 %
CHL CUP STRESS STAGE 7 HR: 133 {beats}/min
CHL CUP STRESS STAGE 7 SBP: 165 mmHg
CHL CUP STRESS STAGE 8 DBP: 91 mmHg
CHL CUP STRESS STAGE 8 GRADE: 0 %
CHL RATE OF PERCEIVED EXERTION: 15
CSEPED: 7 min
CSEPEDS: 0 s
CSEPEW: 8.5 METS
CSEPHR: 103 %
CSEPPHR: 169 {beats}/min
CSEPPMHR: 103 %
MPHR: 163 {beats}/min
Stage 1 Grade: 0 %
Stage 1 HR: 109 {beats}/min
Stage 2 HR: 93 {beats}/min
Stage 3 HR: 94 {beats}/min
Stage 4 Grade: 10 %
Stage 4 HR: 126 {beats}/min
Stage 4 SBP: 163 mmHg
Stage 4 Speed: 1.7 mph
Stage 5 DBP: 97 mmHg
Stage 5 Grade: 12 %
Stage 5 HR: 148 {beats}/min
Stage 5 SBP: 171 mmHg
Stage 6 Grade: 14 %
Stage 6 HR: 169 {beats}/min
Stage 7 Speed: 0 mph
Stage 8 HR: 107 {beats}/min
Stage 8 SBP: 133 mmHg
Stage 8 Speed: 0 mph

## 2015-11-19 LAB — HM COLONOSCOPY

## 2015-11-22 ENCOUNTER — Telehealth: Payer: Self-pay | Admitting: Cardiology

## 2015-11-22 NOTE — Telephone Encounter (Signed)
Per dr Stanford Breed, pt is clear from a cardiac standpoint for root canal. There are no medications she will need to hold. This note will be faxed to the number provided.

## 2015-11-22 NOTE — Telephone Encounter (Signed)
1. What dental office are you calling from? Dr. Alicia Amel  2. What is your office phone and fax number? Fax- [phone]   3. What type of procedure is the patient having performed? Possible root canal  4. What date is procedure scheduled? 4/25   5. What is your question (ex. Antibiotics prior to procedure, holding medication-we need to know how long dentist wants pt to hold med)? Is the pt cleared to have this procedure done and does she need to hold medications.   6.

## 2015-11-23 ENCOUNTER — Encounter: Payer: Self-pay | Admitting: Family Medicine

## 2015-11-23 ENCOUNTER — Encounter: Payer: Self-pay | Admitting: Cardiology

## 2015-11-23 NOTE — Telephone Encounter (Signed)
Please refax,never received clearance,pt is coming today at 9:30. Please fax to [phone].

## 2015-11-23 NOTE — Telephone Encounter (Signed)
This encounter was created in error - please disregard.

## 2016-02-09 ENCOUNTER — Other Ambulatory Visit: Payer: Self-pay | Admitting: Family Medicine

## 2016-02-11 ENCOUNTER — Telehealth: Payer: Self-pay | Admitting: Family Medicine

## 2016-02-11 DIAGNOSIS — S2001XD Contusion of right breast, subsequent encounter: Secondary | ICD-10-CM

## 2016-02-11 NOTE — Telephone Encounter (Signed)
Order placed to Pt preferred facility.
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
## 2016-02-11 NOTE — Telephone Encounter (Signed)
Ok to place order 

## 2016-02-22 ENCOUNTER — Telehealth: Payer: Self-pay

## 2016-02-22 DIAGNOSIS — R928 Other abnormal and inconclusive findings on diagnostic imaging of breast: Secondary | ICD-10-CM

## 2016-02-22 DIAGNOSIS — N6021 Fibroadenosis of right breast: Secondary | ICD-10-CM

## 2016-02-22 NOTE — Telephone Encounter (Signed)
Faxed order to [facility] Fax: [phone]

## 2016-02-29 LAB — HM MAMMOGRAPHY

## 2016-03-27 ENCOUNTER — Ambulatory Visit: Payer: Managed Care, Other (non HMO) | Admitting: Family Medicine

## 2016-03-28 ENCOUNTER — Ambulatory Visit (INDEPENDENT_AMBULATORY_CARE_PROVIDER_SITE_OTHER): Payer: Managed Care, Other (non HMO) | Admitting: Family Medicine

## 2016-03-28 ENCOUNTER — Encounter: Payer: Self-pay | Admitting: Family Medicine

## 2016-03-28 VITALS — BP 138/87 | HR 100

## 2016-03-28 DIAGNOSIS — R51 Headache: Secondary | ICD-10-CM

## 2016-03-28 DIAGNOSIS — E785 Hyperlipidemia, unspecified: Secondary | ICD-10-CM | POA: Diagnosis not present

## 2016-03-28 DIAGNOSIS — I10 Essential (primary) hypertension: Secondary | ICD-10-CM

## 2016-03-28 DIAGNOSIS — R7309 Other abnormal glucose: Secondary | ICD-10-CM

## 2016-03-28 DIAGNOSIS — R519 Headache, unspecified: Secondary | ICD-10-CM

## 2016-03-28 LAB — POCT GLYCOSYLATED HEMOGLOBIN (HGB A1C): Hemoglobin A1C: 5.6

## 2016-03-28 MED ORDER — LOSARTAN POTASSIUM 50 MG PO TABS: 50.0000 mg | ORAL_TABLET | Freq: Every day | ORAL | 1 refills | Status: DC

## 2016-03-28 NOTE — Progress Notes (Signed)
Subjective:    CC: HTN  HPI:  Hypertension- Pt denies chest pain, SOB, dizziness, or heart palpitations.  Taking meds as directed w/o problems.  Denies medication side effects.  She did have a stress test back in April which was normal. She had a dental abscess around that time and had to have a cardiac workup prior to surgery.  Hyperlipidemia -  Tolerating statin well without any problems or difficulty.  She has been having some more frequent headaches. They tend to start within about 30 minutes after eating and then typically last 1-2 hours and then go away. It doesn't happen every time she eats. She denies any not allergies or problems. She notices it can happen more with eating sandwiches and pepperoni pizza.  Past medical history, Surgical history, Family history not pertinant except as noted below, Social history, Allergies, and medications have been entered into the medical record, reviewed, and corrections made.   Review of Systems: No fevers, chills, night sweats, weight loss, chest pain, or shortness of breath.   Objective:    General: Well Developed, well nourished, and in no acute distress.  Neuro: Alert and oriented x3, extra-ocular muscles intact, sensation grossly intact.  HEENT: Normocephalic, atraumatic  Skin: Warm and dry, no rashes. Cardiac: Regular rate and rhythm, no murmurs rubs or gallops, no lower extremity edema.  Respiratory: Clear to auscultation bilaterally. Not using accessory muscles, speaking in full sentences.   Impression and Recommendations:    HTN -  Well controlled. Continue current regimen. Follow up in  6 mo.   Hyperlipidemia-continue atorvastatin. Lipids are up to date from February.  Abnormal glucose from March-we will check A1c today. Next  Headaches after eating-suspect it may be from the nitrates. Recommend that she avoid these in her diet and see if the headaches resolve. If not and please let me know.  Mammogram up-to-date. Copy printed  from care everywhere. Performed at [facility].

## 2016-03-28 NOTE — Addendum Note (Signed)
Addended by: Teddy Spike on: 03/28/2016 01:55 PM   Modules accepted: Orders

## 2016-04-07 ENCOUNTER — Encounter: Payer: Self-pay | Admitting: Family Medicine

## 2016-04-26 ENCOUNTER — Encounter: Payer: Managed Care, Other (non HMO) | Admitting: Family Medicine

## 2016-05-02 ENCOUNTER — Encounter: Payer: Self-pay | Admitting: Family Medicine

## 2016-05-02 ENCOUNTER — Ambulatory Visit (INDEPENDENT_AMBULATORY_CARE_PROVIDER_SITE_OTHER): Payer: Managed Care, Other (non HMO) | Admitting: Family Medicine

## 2016-05-02 VITALS — BP 119/71 | HR 88 | Wt 145.0 lb

## 2016-05-02 DIAGNOSIS — Z0000 Encounter for general adult medical examination without abnormal findings: Secondary | ICD-10-CM | POA: Diagnosis not present

## 2016-05-02 NOTE — Progress Notes (Signed)
   Subjective:     Sara Callahan is a 55 y.o. female and is here for a comprehensive physical exam. The patient reports problems - still wearing boot on her right ankle s/p surgery.  Social History   Social History  . Marital status: Widowed    Spouse name: Shanon Brow  . Number of children: 3  . Years of education: N/A   Occupational History  .  [employer]   Social History Main Topics  . Smoking status: Never Smoker  . Smokeless tobacco: Never Used  . Alcohol use Yes     Comment: "once or twice a year"  . Drug use: No  . Sexual activity: Yes    Partners: Male     Comment: [place], 12 yrs educations, married, 3 kids.   Other Topics Concern  . Not on file   Social History Narrative   +  regular exercise. Takes her calcium supplement.    Health Maintenance  Topic Date Due  . INFLUENZA VACCINE  07/30/2016 (Originally 02/29/2016)  . MAMMOGRAM  02/28/2018  . TETANUS/TDAP  09/30/2019  . COLONOSCOPY  11/18/2020  . Hepatitis C Screening  Completed  . HIV Screening  Completed    The following portions of the patient's history were reviewed and updated as appropriate: allergies, current medications, past family history, past medical history, past social history, past surgical history and problem list.  Review of Systems A comprehensive review of systems was negative.   Objective:    BP 119/71   Pulse 88   Wt 145 lb (65.8 kg)   SpO2 95%   BMI 26.52 kg/m  General appearance: alert, cooperative and appears stated age Head: Normocephalic, without obvious abnormality, atraumatic Eyes: conj clear, EOMI, PEERLA Ears: normal TM's and external ear canals both ears Nose: Nares normal. Septum midline. Mucosa normal. No drainage or sinus tenderness. Throat: lips, mucosa, and tongue normal; teeth and gums normal Neck: no adenopathy, no carotid bruit, no JVD, supple, symmetrical, trachea midline and thyroid not enlarged, symmetric, no  tenderness/mass/nodules Back: symmetric, no curvature. ROM normal. No CVA tenderness. Lungs: clear to auscultation bilaterally Breasts: not examined Heart: regular rate and rhythm, S1, S2 normal, no murmur, click, rub or gallop Abdomen: soft, non-tender; bowel sounds normal; no masses,  no organomegaly Extremities: extremities normal, atraumatic, no cyanosis or edema Pulses: 2+ and symmetric Skin: Skin color, texture, turgor normal. No rashes or lesions Lymph nodes: Cervical, supraclavicular, and axillary nodes normal. Neurologic: Alert and oriented X 3, normal strength and tone. Normal symmetric reflexes. Normal coordination and gait    Assessment:    Healthy female exam.      Plan:     See After Visit Summary for Counseling Recommendations   Keep up a regular exercise program and make sure you are eating a healthy diet Try to eat 4 servings of dairy a day, or if you are lactose intolerant take a calcium with vitamin D daily.  Your vaccines are up to date.

## 2016-05-03 LAB — BASIC METABOLIC PANEL WITH GFR
BUN: 15 mg/dL (ref 7–25)
CALCIUM: 9.2 mg/dL (ref 8.6–10.4)
CO2: 28 mmol/L (ref 20–31)
CREATININE: 0.7 mg/dL (ref 0.50–1.05)
Chloride: 105 mmol/L (ref 98–110)
GFR, Est African American: >89 mL/min (ref >=60)
GFR, Est Non African American: >89 mL/min (ref >=60)
Glucose, Bld: 94 mg/dL (ref 65–99)
Potassium: 4.1 mmol/L (ref 3.5–5.3)
SODIUM: 141 mmol/L (ref 135–146)

## 2016-05-03 NOTE — Progress Notes (Signed)
All labs are normal. 

## 2016-05-16 ENCOUNTER — Other Ambulatory Visit: Payer: Self-pay

## 2016-05-16 MED ORDER — DULOXETINE HCL 60 MG PO CPEP: 60.0000 mg | ORAL_CAPSULE | Freq: Every day | ORAL | 1 refills | Status: DC

## 2016-09-08 ENCOUNTER — Encounter: Payer: Self-pay | Admitting: Sports Medicine

## 2016-09-08 ENCOUNTER — Ambulatory Visit (INDEPENDENT_AMBULATORY_CARE_PROVIDER_SITE_OTHER): Payer: 59 | Admitting: Sports Medicine

## 2016-09-08 DIAGNOSIS — J0100 Acute maxillary sinusitis, unspecified: Secondary | ICD-10-CM

## 2016-09-08 MED ORDER — FLUTICASONE PROPIONATE 50 MCG/ACT NA SUSP: NASAL | 3 refills | Status: DC

## 2016-09-08 MED ORDER — AMOXICILLIN-POT CLAVULANATE 875-125 MG PO TABS: 1.0000 | ORAL_TABLET | Freq: Two times a day (BID) | ORAL | 0 refills | Status: AC

## 2016-09-08 MED ORDER — ONDANSETRON 8 MG PO TBDP
8.0000 mg | ORAL_TABLET | Freq: Three times a day (TID) | ORAL | 3 refills | Status: DC | PRN
Start: 2016-09-08 — End: 2016-11-07

## 2016-09-08 NOTE — Assessment & Plan Note (Signed)
Augmentin, Flonase, Zofran for a bit of nausea. Return as needed.

## 2016-09-08 NOTE — Progress Notes (Signed)
  Subjective:    CC: Feeling sick  HPI: For almost a week now this pleasant 56 year old female has had right-sided facial pain and pressure, drainage from her nose, radiation to the ear. Mild nausea. Initially she did have some fevers and chills, but these have since resolved. Symptoms are moderate, persistent.  Past medical history:  Negative.  See flowsheet/record as well for more information.  Surgical history: Negative.  See flowsheet/record as well for more information.  Family history: Negative.  See flowsheet/record as well for more information.  Social history: Negative.  See flowsheet/record as well for more information.  Allergies, and medications have been entered into the medical record, reviewed, and no changes needed.   Review of Systems: No fevers, chills, night sweats, weight loss, chest pain, or shortness of breath.   Objective:    General: Well Developed, well nourished, and in no acute distress.  Neuro: Alert and oriented x3, extra-ocular muscles intact, sensation grossly intact.  HEENT: Normocephalic, atraumatic, pupils equal round reactive to light, neck supple, no masses, no lymphadenopathy, thyroid nonpalpable. Oropharynx, nasopharynx, ear canals unremarkable, she is markedly tender over the right maxillary sinus. Skin: Warm and dry, no rashes. Cardiac: Regular rate and rhythm, no murmurs rubs or gallops, no lower extremity edema.  Respiratory: Clear to auscultation bilaterally. Not using accessory muscles, speaking in full sentences.  Impression and Recommendations:    Acute non-recurrent maxillary sinusitis Augmentin, Flonase, Zofran for a bit of nausea. Return as needed.

## 2016-09-26 ENCOUNTER — Ambulatory Visit: Payer: Managed Care, Other (non HMO) | Admitting: Family Medicine

## 2016-10-07 ENCOUNTER — Other Ambulatory Visit: Payer: Self-pay | Admitting: Family Medicine

## 2016-10-31 ENCOUNTER — Ambulatory Visit: Payer: Managed Care, Other (non HMO) | Admitting: Family Medicine

## 2016-11-07 ENCOUNTER — Ambulatory Visit (INDEPENDENT_AMBULATORY_CARE_PROVIDER_SITE_OTHER): Payer: 59 | Admitting: Family Medicine

## 2016-11-07 ENCOUNTER — Other Ambulatory Visit: Payer: Self-pay | Admitting: Family Medicine

## 2016-11-07 VITALS — BP 131/77 | HR 97 | Wt 147.0 lb

## 2016-11-07 DIAGNOSIS — E7849 Other hyperlipidemia: Secondary | ICD-10-CM

## 2016-11-07 DIAGNOSIS — E784 Other hyperlipidemia: Secondary | ICD-10-CM | POA: Diagnosis not present

## 2016-11-07 DIAGNOSIS — I10 Essential (primary) hypertension: Secondary | ICD-10-CM

## 2016-11-07 DIAGNOSIS — L819 Disorder of pigmentation, unspecified: Secondary | ICD-10-CM | POA: Diagnosis not present

## 2016-11-07 NOTE — Progress Notes (Signed)
Subjective:    CC: HTN  HPI:  Hypertension- Pt denies chest pain, SOB, dizziness, or heart palpitations.  Taking meds as directed w/o problems.  Denies medication side effects.    She also has 2 lesion on her right posteior forearm present for approx a year. Not painful but bothersome.  No known injury.  They are hard and dry.    Hyperlipidemia - doing well on statin.  No S.E.    Past medical history, Surgical history, Family history not pertinant except as noted below, Social history, Allergies, and medications have been entered into the medical record, reviewed, and corrections made.   Review of Systems: No fevers, chills, night sweats, weight loss, chest pain, or shortness of breath.   Objective:    General: Well Developed, well nourished, and in no acute distress.  Neuro: Alert and oriented x3, extra-ocular muscles intact, sensation grossly intact.  HEENT: Normocephalic, atraumatic  Skin: Warm and dry, no rashes.does have a 3 x 71mm  hard dry raised circular lesion that is light brown on dorsum or right forearm.  Smaller, less raised 2 x 3 mm lesion.  Cardiac: Regular rate and rhythm, no murmurs rubs or gallops, no lower extremity edema.  Respiratory: Clear to auscultation bilaterally. Not using accessory muscles, speaking in full sentences.   Impression and Recommendations:    HTN  -Well controlled. Continue current regimen. Follow up in  6-12 months. Due fpr ;abs   Skin lesion - shave bx performed. Will call with results once available.    Hyperlipidemia - due to recheck lipids.   Shave Biopsy Procedure Note  Pre-operative Diagnosis: Suspicious lesion  Post-operative Diagnosis: same  Locations:right wrist/post forearm.   Indications: irritation  Anesthesia: Lidocaine 1% without epinephrine without added sodium bicarbonate  Procedure Details  Patient informed of the risks (including bleeding and infection) and benefits of the  procedure and Verbal informed consent  obtained.  The lesion and surrounding area were given a sterile prep using chlorhexidine and draped in the usual sterile fashion. A scalpel was used to shave an area of skin approximately 40mm by 62mm on the larger lesion and 3 x 4 mm on the smaller lesion closer to the wrist.  Hemostasis achieved with alumuninum chloride. Antibiotic ointment and a sterile dressing applied.  The specimens were sent for pathologic examination. The patient tolerated the procedure well.  EBL: trace  Findings: Await pathology  Condition: Stable  Complications: none.  Plan: 1. Instructed to keep the wound dry and covered for 24-48h and clean thereafter. 2. Warning signs of infection were reviewed.   3. Recommended that the patient use Tylenol PRN. 4. Return PRN.

## 2016-11-14 LAB — COMPLETE METABOLIC PANEL WITH GFR
ALT: 37 U/L — ABNORMAL HIGH (ref 6–29)
AST: 23 U/L (ref 10–35)
Albumin: 3.8 g/dL (ref 3.6–5.1)
Alkaline Phosphatase: 88 U/L (ref 33–130)
BUN: 11 mg/dL (ref 7–25)
CO2: 27 mmol/L (ref 20–31)
Calcium: 8.9 mg/dL (ref 8.6–10.4)
Chloride: 107 mmol/L (ref 98–110)
Creat: 0.57 mg/dL (ref 0.50–1.05)
GFR, Est African American: >89 mL/min (ref >=60)
GLUCOSE: 92 mg/dL (ref 65–99)
POTASSIUM: 4.2 mmol/L (ref 3.5–5.3)
SODIUM: 142 mmol/L (ref 135–146)
Total Bilirubin: 0.6 mg/dL (ref 0.2–1.2)
Total Protein: 6.3 g/dL (ref 6.1–8.1)

## 2016-11-14 LAB — LIPID PANEL
CHOL/HDL RATIO: 2 ratio (ref <5.0)
Cholesterol: 158 mg/dL (ref <200)
HDL: 79 mg/dL (ref >50)
LDL CALC: 65 mg/dL (ref <100)
Triglycerides: 72 mg/dL (ref <150)
VLDL: 14 mg/dL (ref <30)

## 2016-12-01 ENCOUNTER — Other Ambulatory Visit: Payer: Self-pay | Admitting: Family Medicine

## 2016-12-03 ENCOUNTER — Other Ambulatory Visit: Payer: Self-pay | Admitting: Family Medicine

## 2017-01-09 ENCOUNTER — Other Ambulatory Visit: Payer: Self-pay | Admitting: Family Medicine

## 2017-02-05 LAB — HM MAMMOGRAPHY

## 2017-02-08 ENCOUNTER — Encounter: Payer: Self-pay | Admitting: Family Medicine

## 2017-03-06 ENCOUNTER — Other Ambulatory Visit: Payer: Self-pay | Admitting: Family Medicine

## 2017-05-31 ENCOUNTER — Other Ambulatory Visit: Payer: Self-pay | Admitting: Family Medicine

## 2017-06-05 ENCOUNTER — Other Ambulatory Visit: Payer: Self-pay | Admitting: Family Medicine

## 2017-10-10 ENCOUNTER — Other Ambulatory Visit: Payer: Self-pay | Admitting: Sports Medicine

## 2017-10-10 DIAGNOSIS — J0100 Acute maxillary sinusitis, unspecified: Secondary | ICD-10-CM

## 2017-10-10 NOTE — Telephone Encounter (Signed)
To PCP

## 2017-10-11 ENCOUNTER — Encounter: Payer: Self-pay | Admitting: Family Medicine

## 2017-10-11 ENCOUNTER — Ambulatory Visit (INDEPENDENT_AMBULATORY_CARE_PROVIDER_SITE_OTHER): Payer: BLUE CROSS/BLUE SHIELD | Admitting: Family Medicine

## 2017-10-11 VITALS — BP 148/99 | HR 88 | Ht 62.0 in | Wt 155.0 lb

## 2017-10-11 DIAGNOSIS — I10 Essential (primary) hypertension: Secondary | ICD-10-CM

## 2017-10-11 DIAGNOSIS — M249 Joint derangement, unspecified: Secondary | ICD-10-CM

## 2017-10-11 DIAGNOSIS — E7849 Other hyperlipidemia: Secondary | ICD-10-CM

## 2017-10-11 MED ORDER — LOSARTAN POTASSIUM 100 MG PO TABS: 100.0000 mg | ORAL_TABLET | Freq: Every day | ORAL | 0 refills | Status: DC

## 2017-10-11 NOTE — Progress Notes (Signed)
Subjective:    Patient ID: Sara Callahan, female    DOB: 1960-09-11, 57 y.o.   MRN: [id number]  HPI 57 yo female is here to follow-up for her blood pressure in particular.  Hypertension- Pt denies chest pain, SOB, dizziness, or heart palpitations.  Taking meds as directed w/o problems.  Denies medication side effects.    Follow up hyperlipidemia-she is tolerating the statin well without any side effects or myalgias.  He also has hypermobile joints.  Him back in 2014 she actually fell and fractured several bones in her right hand and 1 of her feet.  She ended up having to have surgery by an orthopedic hand surgeon end up doing PT.  She has had 40% disability in that hand ever since.  She needs forms completed for work for accommodation request.  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 She now has deformity in the pinky hand.     Review of Systems BP (!) 148/99   Pulse 88   Ht 5\' 2"  (1.575 m)   Wt 155 lb (70.3 kg)   BMI 28.35 kg/m     Allergies  Allergen Reactions  . Procaine Palpitations  . Sulfa Antibiotics Anaphylaxis    Stopped breathing  . Decongestant [Pseudoephedrine Hcl Er]     Unknown reaction  . Iodinated Diagnostic Agents Swelling  . Lisinopril     REACTION: Cough  . Procaine Hcl     REACTION: palpitations.  . Propoxyphene N-Acetaminophen   . Sulfonamide Derivatives     REACTION: Sweeling and rash    Past Medical History:  Diagnosis Date  . Goiter   . Hyperlipidemia   . Hypertension   . 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 ABDOMINAL HYSTERECTOMY  14 yrs ago  . CESAREAN SECTION  27yrs ago, 18 yrs ago   x2  . DG C-ARM 2 VIEW RIGHT ANKLE ([place] HX)    . FOOT SURGERY  1.5 yrs ago  . KNEE SURGERY  8 yrs ago   Left   . LIPOMA RESECTION  Aug2012   Left axilla  .  lung tumor removed  5 yrs ago   LT spindle    Social History   Socioeconomic History  . Marital status: Widowed    Spouse name: Shanon Brow  . Number of children: 3  . Years of education: Not on file  . Highest education level: Not on file  Social Needs  . Financial resource strain: Not on file  . Food insecurity - worry: Not on file  . Food insecurity - inability: Not on file  . Transportation needs - medical: Not on file  . Transportation needs - non-medical: Not on file  Occupational History    Employer: [employer]    Comment: [employer]  Tobacco Use  . Smoking status: Never Smoker  . Smokeless tobacco: Never Used  Substance and Sexual Activity  . Alcohol use: Yes    Comment: "once or twice a year"  . Drug use: No  . Sexual activity: Yes    Partners: Male    Comment: [place], 12 yrs educations, married, 3 kids.  Other Topics Concern  . Not on file  Social History Narrative   +  regular exercise. Takes her calcium supplement.     Family History  Problem Relation Age of Onset  . Depression Unknown        cousins  . Diabetes Unknown        grandparents  . Heart attack Unknown        grandparents  . Hypertension Unknown        grandparents  . Osteoporosis Mother   . Throat cancer Paternal Uncle   . Uterine cancer Mother     Outpatient Encounter Medications as of 10/11/2017  Medication Sig  . albuterol (PROVENTIL HFA;VENTOLIN HFA) 108 (90 BASE) MCG/ACT inhaler Inhale 1-2 puffs into the lungs every 4 (four) hours as needed for wheezing or shortness of breath.  Marland Kitchen atorvastatin (LIPITOR) 40 MG tablet Take 1 tablet (40 mg total) daily at 6 PM by mouth.  . DULoxetine (CYMBALTA) 60 MG capsule TAKE ONE CAPSULE BY MOUTH DAILY  . fluticasone (FLONASE) 50 MCG/ACT nasal spray USE 1 SPRAY IN EACH NOSTRIL TWICE DAILY  . losartan (COZAAR) 100 MG tablet Take 1 tablet (100 mg total) by mouth daily.  . [DISCONTINUED] losartan (COZAAR) 50 MG tablet  TAKE 1 TABLET BY MOUTH EVERY DAY   No facility-administered encounter medications on file as of 10/11/2017.          Objective:   Physical Exam  Constitutional: She is oriented to person, place, and time. She appears well-developed and well-nourished.  HENT:  Head: Normocephalic and atraumatic.  Cardiovascular: Normal rate, regular rhythm and normal heart sounds.  Pulmonary/Chest: Effort normal and breath sounds normal.  Musculoskeletal:  Does have some scarring on that right hand as well as some drawing of the fifth digit outward.  She is also unable to flex her fingers fully.  Neurological: She is alert and oriented to person, place, and time.  Skin: Skin is warm and dry.  Psychiatric: She has a normal mood and affect. Her behavior is normal.        Assessment & Plan:  HTN -repeat blood pressure still elevated.  Discussed increasing losartan to 100 mg and have her come back in about 2 weeks for nurse visit.  She prefers to continue her 50mg  and recheck in 2 weeks.  Due for CMP and lipid panel.  Hyperlipidemia-due to recheck lipids today.  Continue current regimen.  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

## 2017-10-15 LAB — ACUTE HEP PANEL AND HEP B SURFACE AB
HEP A IGM: NONREACTIVE
HEP B C IGM: NONREACTIVE
HEPATITIS C ANTIBODY REFILL$(REFL): NONREACTIVE
Hepatitis B Surface Ag: NONREACTIVE
SIGNAL TO CUT-OFF: 0.02 (ref <1.00)

## 2017-10-15 LAB — CBC
HEMATOCRIT: 41.7 % (ref 35.0–45.0)
HEMOGLOBIN: 14.2 g/dL (ref 11.7–15.5)
MCH: 29.4 pg (ref 27.0–33.0)
MCHC: 34.1 g/dL (ref 32.0–36.0)
MCV: 86.3 fL (ref 80.0–100.0)
MPV: 10 fL (ref 7.5–12.5)
Platelets: 386 10*3/uL (ref 140–400)
RBC: 4.83 10*6/uL (ref 3.80–5.10)
RDW: 12.7 % (ref 11.0–15.0)
WBC: 5.3 10*3/uL (ref 3.8–10.8)

## 2017-10-15 LAB — LIPID PANEL
CHOLESTEROL: 169 mg/dL (ref <200)
HDL: 64 mg/dL (ref >50)
LDL Cholesterol (Calc): 91 mg/dL (calc)
Non-HDL Cholesterol (Calc): 105 mg/dL (calc) (ref <130)
Total CHOL/HDL Ratio: 2.6 (calc) (ref <5.0)
Triglycerides: 56 mg/dL (ref <150)

## 2017-10-15 LAB — COMPLETE METABOLIC PANEL WITH GFR
AG Ratio: 1.9 (calc) (ref 1.0–2.5)
ALBUMIN MSPROF: 4.6 g/dL (ref 3.6–5.1)
ALKALINE PHOSPHATASE (APISO): 101 U/L (ref 33–130)
ALT: 49 U/L — ABNORMAL HIGH (ref 6–29)
AST: 25 U/L (ref 10–35)
BILIRUBIN TOTAL: 0.5 mg/dL (ref 0.2–1.2)
BUN: 20 mg/dL (ref 7–25)
CHLORIDE: 106 mmol/L (ref 98–110)
CO2: 28 mmol/L (ref 20–32)
CREATININE: 0.57 mg/dL (ref 0.50–1.05)
Calcium: 9.5 mg/dL (ref 8.6–10.4)
GFR, Est African American: 118 mL/min/{1.73_m2} (ref >=60)
GFR, Est Non African American: 102 mL/min/{1.73_m2} (ref >=60)
GLUCOSE: 96 mg/dL (ref 65–99)
Globulin: 2.4 g/dL (calc) (ref 1.9–3.7)
Potassium: 4.2 mmol/L (ref 3.5–5.3)
Sodium: 141 mmol/L (ref 135–146)
Total Protein: 7 g/dL (ref 6.1–8.1)

## 2017-10-15 LAB — REFLEX TIQ

## 2017-10-15 LAB — TSH: TSH: 1.1 mIU/L (ref 0.40–4.50)

## 2017-10-16 ENCOUNTER — Other Ambulatory Visit: Payer: Self-pay | Admitting: *Deleted

## 2017-10-16 DIAGNOSIS — R748 Abnormal levels of other serum enzymes: Secondary | ICD-10-CM

## 2017-10-18 ENCOUNTER — Telehealth: Payer: Self-pay | Admitting: *Deleted

## 2017-10-18 NOTE — Telephone Encounter (Signed)
Pt's form completed, faxed, confirmation received, and scanned into chart.Sara Callahan, [place]

## 2017-10-18 NOTE — Telephone Encounter (Signed)
Called pt and lvm informing her that forms have been completed and faxed and a confirmation was received.Sara Callahan, [place]

## 2017-10-25 ENCOUNTER — Ambulatory Visit (INDEPENDENT_AMBULATORY_CARE_PROVIDER_SITE_OTHER): Payer: BLUE CROSS/BLUE SHIELD | Admitting: Family Medicine

## 2017-10-25 ENCOUNTER — Telehealth: Payer: Self-pay | Admitting: Family Medicine

## 2017-10-25 ENCOUNTER — Ambulatory Visit (INDEPENDENT_AMBULATORY_CARE_PROVIDER_SITE_OTHER): Payer: BLUE CROSS/BLUE SHIELD

## 2017-10-25 VITALS — BP 128/77 | HR 95 | Temp 97.7°F | Resp 18 | Wt 157.0 lb

## 2017-10-25 DIAGNOSIS — I10 Essential (primary) hypertension: Secondary | ICD-10-CM | POA: Diagnosis not present

## 2017-10-25 DIAGNOSIS — R748 Abnormal levels of other serum enzymes: Secondary | ICD-10-CM

## 2017-10-25 DIAGNOSIS — K8080 Other cholelithiasis without obstruction: Secondary | ICD-10-CM

## 2017-10-25 NOTE — Telephone Encounter (Signed)
Call patient: Ultrasound shows no problems masses or cirrhosis of the liver which is very reassuring.  It does show that she has a lot of gallstones in her gallbladder.  We do not necessarily recommend treatment if it is not causing a problem and this is not contributing to her liver elevation.  Plan to recheck liver again in 6 months.  Continue to work on healthy diet and regular exercise to reduce inflammation.

## 2017-10-25 NOTE — Telephone Encounter (Signed)
Pt advised of results and recommendations.Sara Callahan, [place]

## 2017-10-25 NOTE — Progress Notes (Signed)
HPI: Patient is here for a blood pressure check. Patient denies chest pain, palpitations, shortness of breath, blurred vision, dizziness or medication problem.  Assessment and Plan: Blood pressure reading is within normal limits. Patient advised she will receive a call from our office if follow up nurse visit - BP check - is needed provider reviews her chart.

## 2017-10-25 NOTE — Progress Notes (Signed)
Hypertension-blood pressure looks absolutely fantastic.  Continue current regimen.  Beatrice Lecher, MD

## 2018-01-02 ENCOUNTER — Other Ambulatory Visit: Payer: Self-pay | Admitting: Family Medicine

## 2018-02-06 LAB — HM MAMMOGRAPHY

## 2018-02-22 ENCOUNTER — Encounter: Payer: Self-pay | Admitting: Family Medicine

## 2018-02-28 ENCOUNTER — Other Ambulatory Visit: Payer: Self-pay | Admitting: Family Medicine

## 2018-04-02 ENCOUNTER — Other Ambulatory Visit: Payer: Self-pay | Admitting: Family Medicine

## 2018-04-02 NOTE — Telephone Encounter (Signed)
I do not see any lipid lowering agent on med list. Please advise

## 2018-05-02 ENCOUNTER — Other Ambulatory Visit: Payer: Self-pay | Admitting: *Deleted

## 2018-05-02 MED ORDER — ALBUTEROL SULFATE HFA 108 (90 BASE) MCG/ACT IN AERS: 1.0000 | INHALATION_SPRAY | RESPIRATORY_TRACT | 2 refills | Status: AC | PRN

## 2018-06-02 ENCOUNTER — Other Ambulatory Visit: Payer: Self-pay | Admitting: Physician Assistant

## 2018-07-05 ENCOUNTER — Other Ambulatory Visit: Payer: Self-pay | Admitting: Family Medicine

## 2018-08-01 ENCOUNTER — Other Ambulatory Visit: Payer: Self-pay | Admitting: Family Medicine

## 2018-08-05 ENCOUNTER — Encounter: Payer: Self-pay | Admitting: Family Medicine

## 2018-08-05 ENCOUNTER — Ambulatory Visit (INDEPENDENT_AMBULATORY_CARE_PROVIDER_SITE_OTHER): Payer: Managed Care, Other (non HMO) | Admitting: Family Medicine

## 2018-08-05 VITALS — BP 124/77 | HR 86 | Ht 62.0 in | Wt 142.0 lb

## 2018-08-05 DIAGNOSIS — Z79890 Hormone replacement therapy (postmenopausal): Secondary | ICD-10-CM | POA: Insufficient documentation

## 2018-08-05 DIAGNOSIS — I10 Essential (primary) hypertension: Secondary | ICD-10-CM | POA: Diagnosis not present

## 2018-08-05 DIAGNOSIS — L409 Psoriasis, unspecified: Secondary | ICD-10-CM | POA: Insufficient documentation

## 2018-08-05 DIAGNOSIS — F325 Major depressive disorder, single episode, in full remission: Secondary | ICD-10-CM | POA: Diagnosis not present

## 2018-08-05 DIAGNOSIS — R7309 Other abnormal glucose: Secondary | ICD-10-CM

## 2018-08-05 LAB — POCT GLYCOSYLATED HEMOGLOBIN (HGB A1C): HEMOGLOBIN A1C: 5.4 % (ref 4.0–5.6)

## 2018-08-05 MED ORDER — DULOXETINE HCL 60 MG PO CPEP: ORAL_CAPSULE | ORAL | 1 refills | Status: DC

## 2018-08-05 NOTE — Progress Notes (Signed)
Subjective:    CC: BP and Mood medication   HPI: Hypertension- Pt denies chest pain, SOB, dizziness, or heart palpitations.  Taking meds as directed w/o problems.  Denies medication side effects.    F/U depression - she is doing well overall. She is happy with her Cymbalta and would like to continue it .  She says work is actually been much better.  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  F/U abnormal glucose - due to check an A1C.    She also wanted to let me know that she is been [facility] and said it has made a big difference in her quality of life.  She has the hormone pellets in place.  Past medical history, Surgical history, Family history not pertinant except as noted below, Social history, Allergies, and medications have been entered into the medical record, reviewed, and corrections made.   Review of Systems: No fevers, chills, night sweats, weight loss, chest pain, or shortness of breath.   Objective:    General: Well Developed, well nourished, and in no acute distress.  Neuro: Alert and oriented x3, extra-ocular muscles intact, sensation grossly intact.  HEENT: Normocephalic, atraumatic  Skin: Warm and dry, no rashes. Cardiac: Regular rate and rhythm, no murmurs rubs or gallops, no lower extremity edema.  Respiratory: Clear to auscultation bilaterally. Not using accessory muscles, speaking in full sentences.   Impression and Recommendations:    HTN -   Well controlled. Continue current regimen. Follow up in  1months.   Depression Disorder - Well controlled. Continue current regimen. Follow up in  6 months. PHQ- 9 score of 1.   Abnormal glucose - A1C looks great!!  A1C of 5.4.    She is on hormone [facility].  She is getting her mammogram done regularly.  She is had a hysterectomy so does not need a Pap smear.

## 2018-08-06 LAB — BASIC METABOLIC PANEL WITH GFR
BUN: 21 mg/dL (ref 7–25)
CO2: 29 mmol/L (ref 20–32)
CREATININE: 0.58 mg/dL (ref 0.50–1.05)
Calcium: 9 mg/dL (ref 8.6–10.4)
Chloride: 104 mmol/L (ref 98–110)
GFR, Est African American: 117 mL/min/{1.73_m2} (ref >=60)
GFR, Est Non African American: 101 mL/min/{1.73_m2} (ref >=60)
GLUCOSE: 89 mg/dL (ref 65–99)
Potassium: 4.1 mmol/L (ref 3.5–5.3)
SODIUM: 141 mmol/L (ref 135–146)

## 2018-08-06 NOTE — Progress Notes (Signed)
All labs are normal. 

## 2019-01-20 LAB — HM MAMMOGRAPHY

## 2019-01-28 LAB — TSH: TSH: 0.77 (ref 0.41–5.90)

## 2019-01-30 ENCOUNTER — Encounter: Payer: Self-pay | Admitting: Family Medicine

## 2019-01-30 ENCOUNTER — Other Ambulatory Visit: Payer: Self-pay | Admitting: Family Medicine

## 2019-02-03 ENCOUNTER — Other Ambulatory Visit: Payer: Self-pay

## 2019-02-03 ENCOUNTER — Encounter: Payer: Self-pay | Admitting: Family Medicine

## 2019-02-03 ENCOUNTER — Ambulatory Visit (INDEPENDENT_AMBULATORY_CARE_PROVIDER_SITE_OTHER): Payer: Managed Care, Other (non HMO) | Admitting: Family Medicine

## 2019-02-03 VITALS — BP 128/79 | HR 84 | Ht 62.0 in | Wt 148.0 lb

## 2019-02-03 DIAGNOSIS — R740 Nonspecific elevation of levels of transaminase and lactic acid dehydrogenase [LDH]: Secondary | ICD-10-CM

## 2019-02-03 DIAGNOSIS — R7309 Other abnormal glucose: Secondary | ICD-10-CM

## 2019-02-03 DIAGNOSIS — I10 Essential (primary) hypertension: Secondary | ICD-10-CM

## 2019-02-03 DIAGNOSIS — E7849 Other hyperlipidemia: Secondary | ICD-10-CM

## 2019-02-03 DIAGNOSIS — Z23 Encounter for immunization: Secondary | ICD-10-CM

## 2019-02-03 DIAGNOSIS — R7401 Elevation of levels of liver transaminase levels: Secondary | ICD-10-CM

## 2019-02-03 DIAGNOSIS — Z6827 Body mass index (BMI) 27.0-27.9, adult: Secondary | ICD-10-CM

## 2019-02-03 DIAGNOSIS — F325 Major depressive disorder, single episode, in full remission: Secondary | ICD-10-CM

## 2019-02-03 LAB — POCT GLYCOSYLATED HEMOGLOBIN (HGB A1C): Hemoglobin A1C: 5.6 % (ref 4.0–5.6)

## 2019-02-03 NOTE — Assessment & Plan Note (Signed)
Well controlled. Continue current regimen. Follow up in  6 mo  

## 2019-02-03 NOTE — Progress Notes (Addendum)
Established Patient Office Visit  Subjective:  Patient ID: Sara Callahan, female    DOB: 12-Aug-1960  Age: 58 y.o. MRN: [id number]  CC:  Chief Complaint  Patient presents with  . Hypertension  . ifg    HPI Sara Callahan presents for   Hypertension- Pt denies chest pain, SOB, dizziness, or heart palpitations.  Taking meds as directed w/o problems.  Denies medication side effects.    Hyperlipidemia - tolerating stating well with no myalgias or significant side effects.    She has been working from home and has been eating a little more. No regular exercise currently.  She has been doing some hormone replacement therapy through [facility] which is a weight loss program.  She did bring in some recent labs that she had done including thyroid, testosterone level, DHEA, estradiol and progesterone.  Overall her levels were normal except the testosterone was a little bit on the high end.  She is not doing any type of phentermine or hCG injections with them.  She does feel like it is been helpful.  Also discussed need for shingles vaccine.  Currently on Cymbalta.  Happy with her current regimen feels like it is effective.  Lab Results  Component Value Date   CHOL 169 10/11/2017   CHOL 158 11/14/2016   CHOL 175 09/09/2015   Lab Results  Component Value Date   HDL 64 10/11/2017   HDL 79 11/14/2016   HDL 66 09/09/2015   Lab Results  Component Value Date   LDLCALC 91 10/11/2017   LDLCALC 65 11/14/2016   LDLCALC 95 09/09/2015   Lab Results  Component Value Date   TRIG 56 10/11/2017   TRIG 72 11/14/2016   TRIG 72 09/09/2015   Lab Results  Component Value Date   CHOLHDL 2.6 10/11/2017   CHOLHDL 2.0 11/14/2016   CHOLHDL 3.9 07/28/2014   No results found for: LDLDIRECT   Past Medical History:  Diagnosis Date  . Goiter   . Hyperlipidemia   . Hypertension   . 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 ABDOMINAL HYSTERECTOMY  14 yrs ago  . CESAREAN SECTION  48yrs ago, 18 yrs ago   x2  . DG C-ARM 2 VIEW RIGHT ANKLE ([place] [facility])    . FOOT SURGERY  1.5 yrs ago  . KNEE SURGERY  8 yrs ago   Left   . LIPOMA RESECTION  Aug2012   Left axilla  . lung tumor removed  5 yrs ago   LT spindle    Family History  Problem Relation Age of Onset  . Depression Unknown        cousins  . Diabetes Unknown        grandparents  . Heart attack Unknown        grandparents  . Hypertension Unknown        grandparents  . Osteoporosis Mother   . Throat cancer Paternal Uncle   . Uterine cancer Mother     Social History   Socioeconomic History  . Marital status: Widowed    Spouse name: Shanon Brow  . Number of children: 3  . Years of education: Not on file  . Highest education level: Not on file  Occupational History    Employer: [employer]    Comment: [employer]  Social Needs  . Financial resource strain: Not on file  .  Food insecurity    Worry: Not on file    Inability: Not on file  . Transportation needs    Medical: Not on file    Non-medical: Not on file  Tobacco Use  . Smoking status: Never Smoker  . Smokeless tobacco: Never Used  Substance and Sexual Activity  . Alcohol use: Yes    Comment: "once or twice a year"  . Drug use: No  . Sexual activity: Yes    Partners: Male    Comment: [employer], 12 yrs educations, married, 3 kids.  Lifestyle  . Physical activity    Days per week: Not on file    Minutes per session: Not on file  . Stress: Not on file  Relationships  . Social Herbalist on phone: Not on file    Gets together: Not on file    Attends religious service: Not on file    Active member of club or organization: Not on file    Attends meetings of clubs or organizations: Not on file    Relationship status: Not on file  . Intimate partner violence    Fear of current or ex partner: Not on file    Emotionally  abused: Not on file    Physically abused: Not on file    Forced sexual activity: Not on file  Other Topics Concern  . Not on file  Social History Narrative   +  regular exercise. Takes her calcium supplement.     Outpatient Medications Prior to Visit  Medication Sig Dispense Refill  . albuterol (PROVENTIL HFA;VENTOLIN HFA) 108 (90 Base) MCG/ACT inhaler Inhale 1-2 puffs into the lungs every 4 (four) hours as needed for wheezing or shortness of breath. 1 Inhaler 2  . atorvastatin (LIPITOR) 40 MG tablet Take 1 tablet (40 mg total) by mouth daily at 6 PM. Needs appt for refills 30 tablet 0  . DULoxetine (CYMBALTA) 60 MG capsule TAKE 1 CAPSULE BY MOUTH EVERY DAY 90 capsule 1  . fluticasone (FLONASE) 50 MCG/ACT nasal spray USE 1 SPRAY IN EACH NOSTRIL TWICE DAILY 48 g 2  . losartan (COZAAR) 100 MG tablet TAKE 1 TABLET BY MOUTH EVERY DAY 90 tablet 1   No facility-administered medications prior to visit.     Allergies  Allergen Reactions  . Procaine Palpitations  . Sulfa Antibiotics Anaphylaxis    Stopped breathing  . Hydrocodone-Acetaminophen Other (See Comments)    NIGHT SWEATS  . Decongestant [Pseudoephedrine Hcl Er]     Unknown reaction  . Iodinated Diagnostic Agents Swelling  . Lisinopril     REACTION: Cough  . Procaine Hcl     REACTION: palpitations.  . Propoxyphene N-Acetaminophen   . Sulfonamide Derivatives     REACTION: Sweeling and rash    ROS Review of Systems    Objective:    Physical Exam  Constitutional: She is oriented to person, place, and time. She appears well-developed and well-nourished.  HENT:  Head: Normocephalic and atraumatic.  Cardiovascular: Normal rate, regular rhythm and normal heart sounds.  Pulmonary/Chest: Effort normal and breath sounds normal.  Neurological: She is alert and oriented to person, place, and time.  Skin: Skin is warm and dry.  Psychiatric: She has a normal mood and affect. Her behavior is normal.    BP 128/79   Pulse 84    Ht 5\' 2"  (1.575 m)   Wt 148 lb (67.1 kg)   SpO2 99%   BMI 27.07 kg/m  Wt Readings from Last 3 Encounters:  02/03/19 148 lb (67.1 kg)  08/05/18 142 lb (64.4 kg)  10/25/17 157 lb 0.6 oz (71.2 kg)     There are no preventive care reminders to display for this patient.  There are no preventive care reminders to display for this patient.  Lab Results  Component Value Date   TSH 1.10 10/11/2017   Lab Results  Component Value Date   WBC 5.3 10/11/2017   HGB 14.2 10/11/2017   HCT 41.7 10/11/2017   MCV 86.3 10/11/2017   PLT 386 10/11/2017   Lab Results  Component Value Date   NA 141 08/05/2018   K 4.1 08/05/2018   CO2 29 08/05/2018   GLUCOSE 89 08/05/2018   BUN 21 08/05/2018   CREATININE 0.58 08/05/2018   BILITOT 0.5 10/11/2017   ALKPHOS 88 11/14/2016   AST 25 10/11/2017   ALT 49 (H) 10/11/2017   PROT 7.0 10/11/2017   ALBUMIN 3.8 11/14/2016   CALCIUM 9.0 08/05/2018   Lab Results  Component Value Date   CHOL 169 10/11/2017   Lab Results  Component Value Date   HDL 64 10/11/2017   Lab Results  Component Value Date   LDLCALC 91 10/11/2017   Lab Results  Component Value Date   TRIG 56 10/11/2017   Lab Results  Component Value Date   CHOLHDL 2.6 10/11/2017   Lab Results  Component Value Date   HGBA1C 5.6 02/03/2019      Assessment & Plan:   Problem List Items Addressed This Visit      Cardiovascular and Mediastinum   HYPERTENSION, BENIGN ESSENTIAL - Primary    Well controlled. Continue current regimen. Follow up in  6 mo      Relevant Orders   POCT glycosylated hemoglobin (Hb A1C) (Completed)   Lipid panel   COMPLETE METABOLIC PANEL WITH GFR     Other   Hyperlipidemia    Due to recheck lipids.       Relevant Orders   POCT glycosylated hemoglobin (Hb A1C) (Completed)   Lipid panel   COMPLETE METABOLIC PANEL WITH GFR   Depression, major, single episode, complete remission ([place])    Well controlled. Continue current regimen. Follow up in  6  months.        Other Visit Diagnoses    Abnormal glucose       Relevant Orders   POCT glycosylated hemoglobin (Hb A1C) (Completed)   Lipid panel   COMPLETE METABOLIC PANEL WITH GFR   Elevated ALT measurement       Relevant Orders   COMPLETE METABOLIC PANEL WITH GFR   Need for zoster vaccination       Relevant Orders   Varicella-zoster vaccine IM (Shingrix) (Completed)     Lab Results  Component Value Date   HGBA1C 5.6 02/03/2019     No orders of the defined types were placed in this encounter.   Follow-up: Return in about 6 months (around 08/06/2019).    Beatrice Lecher, MD

## 2019-02-03 NOTE — Assessment & Plan Note (Signed)
Due to recheck lipids. 

## 2019-02-03 NOTE — Assessment & Plan Note (Signed)
Well controlled. Continue current regimen. Follow up in  6 months.  

## 2019-02-04 ENCOUNTER — Other Ambulatory Visit: Payer: Self-pay | Admitting: *Deleted

## 2019-02-04 DIAGNOSIS — E7849 Other hyperlipidemia: Secondary | ICD-10-CM

## 2019-02-04 DIAGNOSIS — I10 Essential (primary) hypertension: Secondary | ICD-10-CM

## 2019-02-04 DIAGNOSIS — F325 Major depressive disorder, single episode, in full remission: Secondary | ICD-10-CM

## 2019-02-04 LAB — COMPLETE METABOLIC PANEL WITH GFR
AG Ratio: 2 (calc) (ref 1.0–2.5)
ALT: 44 U/L — ABNORMAL HIGH (ref 6–29)
AST: 21 U/L (ref 10–35)
Albumin: 4.3 g/dL (ref 3.6–5.1)
Alkaline phosphatase (APISO): 77 U/L (ref 37–153)
BUN: 12 mg/dL (ref 7–25)
CO2: 27 mmol/L (ref 20–32)
Calcium: 9 mg/dL (ref 8.6–10.4)
Chloride: 106 mmol/L (ref 98–110)
Creat: 0.57 mg/dL (ref 0.50–0.99)
GFR, Est African American: 117 mL/min/{1.73_m2} (ref >=60)
GFR, Est Non African American: 101 mL/min/{1.73_m2} (ref >=60)
Globulin: 2.2 g/dL (calc) (ref 1.9–3.7)
Glucose, Bld: 78 mg/dL (ref 65–139)
Potassium: 4.3 mmol/L (ref 3.5–5.3)
Sodium: 140 mmol/L (ref 135–146)
Total Bilirubin: 0.5 mg/dL (ref 0.2–1.2)
Total Protein: 6.5 g/dL (ref 6.1–8.1)

## 2019-02-04 LAB — LIPID PANEL
Cholesterol: 137 mg/dL (ref <200)
HDL: 60 mg/dL (ref >=50)
LDL Cholesterol (Calc): 63 mg/dL (calc)
Non-HDL Cholesterol (Calc): 77 mg/dL (calc) (ref <130)
Total CHOL/HDL Ratio: 2.3 (calc) (ref <5.0)
Triglycerides: 67 mg/dL (ref <150)

## 2019-02-04 MED ORDER — ATORVASTATIN CALCIUM 40 MG PO TABS: 40.0000 mg | ORAL_TABLET | Freq: Every day | ORAL | 4 refills | Status: DC

## 2019-02-04 MED ORDER — DULOXETINE HCL 60 MG PO CPEP: ORAL_CAPSULE | ORAL | 1 refills | Status: DC

## 2019-02-04 MED ORDER — LOSARTAN POTASSIUM 100 MG PO TABS: 100.0000 mg | ORAL_TABLET | Freq: Every day | ORAL | 1 refills | Status: DC

## 2019-02-07 ENCOUNTER — Encounter: Payer: Self-pay | Admitting: Family Medicine

## 2019-02-07 LAB — THYROXINE (T4) FREE, DIRECT
Estradiol: 49.1
FSH: 44.7
Free T3: 3.8
Free T4: 0.83
Progesterone: 2.1
Testosterone: 183

## 2019-03-22 ENCOUNTER — Encounter: Payer: Self-pay | Admitting: Emergency Medicine

## 2019-03-22 ENCOUNTER — Emergency Department (INDEPENDENT_AMBULATORY_CARE_PROVIDER_SITE_OTHER)
Admission: EM | Admit: 2019-03-22 | Discharge: 2019-03-22 | Disposition: A | Discharge status: Home / Self Care | Payer: Managed Care, Other (non HMO) | Source: Home / Self Care

## 2019-03-22 ENCOUNTER — Other Ambulatory Visit: Payer: Self-pay

## 2019-03-22 DIAGNOSIS — R110 Nausea: Secondary | ICD-10-CM

## 2019-03-22 DIAGNOSIS — H5500 Unspecified nystagmus: Secondary | ICD-10-CM

## 2019-03-22 DIAGNOSIS — R42 Dizziness and giddiness: Secondary | ICD-10-CM

## 2019-03-22 DIAGNOSIS — R270 Ataxia, unspecified: Secondary | ICD-10-CM

## 2019-03-22 DIAGNOSIS — R739 Hyperglycemia, unspecified: Secondary | ICD-10-CM

## 2019-03-22 LAB — POCT FASTING CBG KUC MANUAL ENTRY: POCT Glucose (KUC): 129 mg/dL — AB (ref 70–99)

## 2019-03-22 MED ORDER — ONDANSETRON 4 MG PO TBDP
4.0000 mg | ORAL_TABLET | Freq: Once | ORAL | Status: AC
  Administered 2019-03-22: 4 mg via ORAL

## 2019-03-22 NOTE — Discharge Instructions (Signed)
°  You have declined EMS transport.  Please have your boyfriend drive you directly to the emergency department for further testing and treatment.

## 2019-03-22 NOTE — ED Triage Notes (Signed)
Pt c/o dizziness that started around 4pm yesterday. Nausea but denies vomiting. No meds for this.

## 2019-03-22 NOTE — ED Provider Notes (Signed)
[facility]    CSN: [id number] Arrival date & time: 03/22/19  1008      History   Chief Complaint Chief Complaint  Patient presents with  . Dizziness    HPI EMONY SHAO is a 58 y.o. female.   HPI  BRIONNA BRUGH is a 58 y.o. female presenting to UC with c/o gradually worsening dizziness that started yesterday around 4PM when she woke up from a nap on her couch.  She had a mild HA that improved with Tylenol but dizziness has worsened.  Associated nausea and dry heaves.  She lives with her boyfriend who helped her walk to the kitchen last night to eat dinner around 8PM but she has not tried to eat or drink anything today besides water. She needs help walking due to feeling so unsteady with the dizziness. Light tends to bother her eyes but she denies HA.  Denies fever, chills, cough, congestion, sore throat, ear pain or ringing. No weakness or numbness in arms or legs.  No prior hx of vertigo or stroke.  No urinary symptoms. She has been told she is borderline diabetic.    Past Medical History:  Diagnosis Date  . Goiter   . Hyperlipidemia   . Hypertension   . 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 Psoriasis 08/05/2018  . Hormone replacement therapy (HRT) 08/05/2018  . Hypermobile joints 10/11/2017  . Chest pain 11/17/2015  . Abnormal electrocardiogram 11/17/2015  . S/P peroneal tendon repair 05/21/2015  . Multinodular goiter 10/28/2013  . Hyperlipidemia 05/31/2010  . ANKLE PAIN, RIGHT 05/31/2010  . POSTMENOPAUSAL STATUS 09/29/2009  . SHOULDER PAIN 10/26/2008  . HOT FLASHES 06/02/2008  . HIATAL HERNIA WITH REFLUX 02/03/2008  . HYPERTENSION, BENIGN ESSENTIAL 01/08/2007  . ASTHMA 01/08/2007  . MIGRAINE HEADACHE 07/06/2006  . RHINITIS, ALLERGIC NOS 07/06/2006  . Depression, major, single episode, complete remission ([place]) 05/08/2006    Past Surgical History:  Procedure  Laterality Date  . ABDOMINAL HYSTERECTOMY  14 yrs ago  . CESAREAN SECTION  72yrs ago, 18 yrs ago   x2  . DG C-ARM 2 VIEW RIGHT ANKLE ([place] HX)    . FOOT SURGERY  1.5 yrs ago  . KNEE SURGERY  8 yrs ago   Left   . LIPOMA RESECTION  Aug2012   Left axilla  . lung tumor removed  5 yrs ago   LT spindle    OB History   No obstetric history on file.      Home Medications    Prior to Admission medications   Medication Sig Start Date End Date Taking? Authorizing Provider  albuterol (PROVENTIL HFA;VENTOLIN HFA) 108 (90 Base) MCG/ACT inhaler Inhale 1-2 puffs into the lungs every 4 (four) hours as needed for wheezing or shortness of breath. 05/02/18   Hali Marry, MD  atorvastatin (LIPITOR) 40 MG tablet Take 1 tablet (40 mg total) by mouth daily at 6 PM. 02/04/19   Hali Marry, MD  DULoxetine (CYMBALTA) 60 MG capsule TAKE 1 CAPSULE BY MOUTH EVERY DAY 02/04/19   Hali Marry, MD  fluticasone [facility]) 50 MCG/ACT nasal spray USE 1 SPRAY IN EACH NOSTRIL TWICE DAILY 10/10/17   Hali Marry, MD  losartan (COZAAR) 100 MG tablet Take 1 tablet (100 mg total) by mouth daily. 02/04/19   Hali Marry, MD    Family History Family History  Problem Relation Age of Onset  . Osteoporosis Mother   . Uterine cancer Mother   . Depression Other        cousins  . Diabetes Other        grandparents  . Heart attack Other        grandparents  . Hypertension Other        grandparents  . Throat cancer Paternal Uncle     Social History Social History   Tobacco Use  . Smoking status: Never Smoker  . Smokeless tobacco: Never Used  Substance Use Topics  . Alcohol use: Yes    Comment: "once or twice a year"  . Drug use: No     Allergies   Procaine, Sulfa antibiotics, Hydrocodone-acetaminophen, Decongestant [pseudoephedrine hcl er], Iodinated diagnostic agents, Lisinopril, Procaine hcl, Propoxyphene n-acetaminophen, and Sulfonamide derivatives   Review of  Systems Review of Systems  Constitutional: Negative for chills and fever.  HENT: Negative for congestion, ear pain, sore throat, trouble swallowing and voice change.   Eyes: Positive for photophobia and visual disturbance (due to dizziness, hard to keep eyes open).  Respiratory: Negative for cough and shortness of breath.   Cardiovascular: Negative for chest pain and palpitations.  Gastrointestinal: Positive for nausea and vomiting (dry heaves). Negative for abdominal pain and diarrhea.  Musculoskeletal: Negative for arthralgias, back pain and myalgias.  Skin: Negative for rash.  Neurological: Positive for dizziness. Negative for facial asymmetry, speech difficulty, weakness, light-headedness, numbness and headaches.     Physical Exam Triage Vital Signs ED Triage Vitals  Enc Vitals Group     BP 03/22/19 1101 136/82     Pulse Rate 03/22/19 1101 70     Resp --      Temp 03/22/19 1101 98.2 F (36.8 C)     Temp Source 03/22/19 1101 Oral     SpO2 03/22/19 1101 97 %     Weight --      Height --      Head Circumference --      Peak Flow --      Pain Score 03/22/19 1102 0     Pain Loc --      Pain Edu? --      Excl. in [place]? --    No data found.  Updated Vital Signs BP 136/82 (BP Location: Right Arm)   Pulse 70   Temp 98.2 F (36.8 C) (Oral)   SpO2 97%   Visual Acuity Right Eye Distance:   Left Eye Distance:   Bilateral Distance:    Right Eye Near:   Left Eye Near:    Bilateral Near:     Physical Exam Vitals signs and nursing note reviewed.  Constitutional:      Appearance: Normal appearance. She is well-developed.  HENT:     Head: Normocephalic and atraumatic.     Right Ear: Tympanic membrane normal.     Left Ear: Tympanic membrane normal.     Nose: Nose normal.     Right Sinus: No maxillary sinus tenderness or frontal sinus tenderness.     Left Sinus: No maxillary sinus tenderness or frontal sinus tenderness.     Mouth/Throat:     Lips: Pink.     Mouth: Mucous  membranes are moist.     Pharynx: Oropharynx is clear. Uvula midline.  Eyes:     Extraocular Movements:     Right eye: Nystagmus present.     Left eye: Nystagmus present.     Pupils: Pupils are  equal, round, and reactive to light.     Comments: Significant horizontal nystagmus in both eyes.   Neck:     Musculoskeletal: Normal range of motion.  Cardiovascular:     Rate and Rhythm: Normal rate and regular rhythm.  Pulmonary:     Effort: Pulmonary effort is normal.     Breath sounds: Normal breath sounds.  Musculoskeletal: Normal range of motion.  Skin:    General: Skin is warm and dry.  Neurological:     Mental Status: She is alert and oriented to person, place, and time.     Comments: Speech is clear, alert to person, place and time. Face is symmetric. Able to follow 2 step commands. Unable to ambulate due to severe dizziness.  Psychiatric:        Behavior: Behavior normal.      UC Treatments / Results  Labs (all labs ordered are listed, but only abnormal results are displayed) Labs Reviewed  POCT FASTING CBG [facility] MANUAL ENTRY - Abnormal; Notable for the following components:      Result Value   POCT Glucose ([facility]) 129 (*)    All other components within normal limits    EKG   Radiology No results found.  Procedures Procedures (including critical care time)  Medications Ordered in UC Medications  ondansetron (ZOFRAN-ODT) disintegrating tablet 4 mg (4 mg Oral Given 03/22/19 1105)    Initial Impression / Assessment and Plan / UC Course  I have reviewed the triage vital signs and the nursing notes.  Pertinent labs & imaging results that were available during my care of the patient were reviewed by me and considered in my medical decision making (see chart for details).     Due to severity of dizziness as well as not prior hx of vertigo, encouraged pt to be evaluated further in emergency department. Pt declined EMS transport but is agreeable to be taken to  [facility] by her brother.   Final Clinical Impressions(s) / UC Diagnoses   Final diagnoses:  Dizziness  Nausea without vomiting  Ataxia  Nystagmus  Hyperglycemia     Discharge Instructions      You have declined EMS transport.  Please have your boyfriend drive you directly to the emergency department for further testing and treatment.     ED Prescriptions    None     Controlled Substance Prescriptions [place] Controlled Substance Registry consulted? Not Applicable   Tyrell Antonio 03/22/19 1422

## 2019-03-24 ENCOUNTER — Telehealth: Payer: Self-pay | Admitting: Family Medicine

## 2019-03-24 NOTE — Telephone Encounter (Signed)
How about 1 oclock on Wed this week.

## 2019-03-24 NOTE — Telephone Encounter (Signed)
Patient was seen at [facility] in [place] for vertigo. Patient needs to be seen this week for a hospital follow up. No appropriative time slots available. Please advise.

## 2019-03-25 NOTE — Telephone Encounter (Signed)
Left patient a voicemail with information below. Let patient know to call us back to schedule an appointment in the office. °

## 2019-03-25 NOTE — Telephone Encounter (Signed)
Contact patient again and let her know that we can work her in tomorrow at 1pm. Patient stated that the hospital called and told her to follow up with neurologist, Dr. Driscilla Moats. Truett Mainland, MD from [place]. Patient is suppose to seeing him at 1pm tomorrow and will be sending [place] information from appointment. Did let patient know if she needed anything or wanted to follow up still she can call and we can schedule that for her. No further questions at this time.

## 2019-08-04 ENCOUNTER — Ambulatory Visit (INDEPENDENT_AMBULATORY_CARE_PROVIDER_SITE_OTHER): Payer: Managed Care, Other (non HMO) | Admitting: Family Medicine

## 2019-08-04 ENCOUNTER — Other Ambulatory Visit: Payer: Self-pay

## 2019-08-04 ENCOUNTER — Encounter: Payer: Self-pay | Admitting: Family Medicine

## 2019-08-04 VITALS — BP 119/74 | HR 88 | Ht 62.0 in | Wt 151.0 lb

## 2019-08-04 DIAGNOSIS — J4520 Mild intermittent asthma, uncomplicated: Secondary | ICD-10-CM

## 2019-08-04 DIAGNOSIS — F325 Major depressive disorder, single episode, in full remission: Secondary | ICD-10-CM | POA: Diagnosis not present

## 2019-08-04 DIAGNOSIS — R748 Abnormal levels of other serum enzymes: Secondary | ICD-10-CM | POA: Diagnosis not present

## 2019-08-04 DIAGNOSIS — I10 Essential (primary) hypertension: Secondary | ICD-10-CM | POA: Diagnosis not present

## 2019-08-04 MED ORDER — DULOXETINE HCL 60 MG PO CPEP: ORAL_CAPSULE | ORAL | 1 refills | Status: DC

## 2019-08-04 MED ORDER — LOSARTAN POTASSIUM 100 MG PO TABS: 100.0000 mg | ORAL_TABLET | Freq: Every day | ORAL | 1 refills | Status: DC

## 2019-08-04 NOTE — Assessment & Plan Note (Addendum)
Well controlled. Continue current regimen. Follow up in  6 mo. Check CR since on ARB.

## 2019-08-04 NOTE — Assessment & Plan Note (Signed)
Stable.  PHQ-9 of 3 and GAD-7 score of 2.  Continue current regimen with Cymbalta.  No changes or adjustments made today.  Follow-up in 6 months.

## 2019-08-04 NOTE — Progress Notes (Signed)
Established Patient Office Visit  Subjective:  Patient ID: Sara Callahan, female    DOB: 01/31/1961  Age: 59 y.o. MRN: [id number]  CC:  Chief Complaint  Patient presents with  . Hypertension    HPI Sara Callahan presents for   Hypertension- Pt denies chest pain, SOB, dizziness, or heart palpitations.  Taking meds as directed w/o problems.  Denies medication side effects.    F/U depression -mood overall is good.  She just reports a little bit of low energy but denies any depression or anxiety. She is on cymbalta and happy with her dose.    F/U Asthma - she is doing well.  Rarely uses her albuterol.  Did refill it over the summer ago.  Past Medical History:  Diagnosis Date  . Goiter   . Hyperlipidemia   . Hypertension   . 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 ABDOMINAL HYSTERECTOMY  14 yrs ago  . CESAREAN SECTION  84yrs ago, 18 yrs ago   x2  . DG C-ARM 2 VIEW RIGHT ANKLE ([place] [facility])    . FOOT SURGERY  1.5 yrs ago  . KNEE SURGERY  8 yrs ago   Left   . LIPOMA RESECTION  Aug2012   Left axilla  . lung tumor removed  5 yrs ago   LT spindle    Family History  Problem Relation Age of Onset  . Osteoporosis Mother   . Uterine cancer Mother   . Depression Other        cousins  . Diabetes Other        grandparents  . Heart attack Other        grandparents  . Hypertension Other        grandparents  . Throat cancer Paternal Uncle     Social History   Socioeconomic History  . Marital status: Widowed    Spouse name: Shanon Brow  . Number of children: 3  . Years of education: Not on file  . Highest education level: Not on file  Occupational History    Employer: [employer]    Comment: [employer]  Tobacco Use  . Smoking status: Never Smoker  . Smokeless tobacco: Never Used  Substance and Sexual Activity  . Alcohol use: Yes    Comment: "once or twice a year"  . Drug  use: No  . Sexual activity: Yes    Partners: Male    Comment: [place], 12 yrs educations, married, 3 kids.  Other Topics Concern  . Not on file  Social History Narrative   +  regular exercise. Takes her calcium supplement.    Social Determinants of Health   Financial Resource Strain:   . Difficulty of Paying Living Expenses: Not on file  Food Insecurity:   . Worried About Charity fundraiser in the Last Year: Not on file  . Ran Out of Food in the Last Year: Not on file  Transportation Needs:   . Lack of Transportation (Medical): Not on file  . Lack of Transportation (Non-Medical): Not on file  Physical Activity:   . Days of Exercise per Week: Not on file  . Minutes of Exercise per Session: Not on file  Stress:   . Feeling of Stress : Not on file  Social Connections:   . Frequency of Communication with Friends and Family: Not on  file  . Frequency of Social Gatherings with Friends and Family: Not on file  . Attends Religious Services: Not on file  . Active Member of Clubs or Organizations: Not on file  . Attends Archivist Meetings: Not on file  . Marital Status: Not on file  Intimate Partner Violence:   . Fear of Current or Ex-Partner: Not on file  . Emotionally Abused: Not on file  . Physically Abused: Not on file  . Sexually Abused: Not on file    Outpatient Medications Prior to Visit  Medication Sig Dispense Refill  . albuterol (PROVENTIL HFA;VENTOLIN HFA) 108 (90 Base) MCG/ACT inhaler Inhale 1-2 puffs into the lungs every 4 (four) hours as needed for wheezing or shortness of breath. 1 Inhaler 2  . atorvastatin (LIPITOR) 40 MG tablet Take 1 tablet (40 mg total) by mouth daily at 6 PM. 90 tablet 4  . fluticasone (FLONASE) 50 MCG/ACT nasal spray USE 1 SPRAY IN EACH NOSTRIL TWICE DAILY 48 g 2  . DULoxetine (CYMBALTA) 60 MG capsule TAKE 1 CAPSULE BY MOUTH EVERY DAY 90 capsule 1  . losartan (COZAAR) 100 MG tablet Take 1 tablet (100 mg total) by  mouth daily. 90 tablet 1   No facility-administered medications prior to visit.    Allergies  Allergen Reactions  . Procaine Palpitations  . Sulfa Antibiotics Anaphylaxis    Stopped breathing  . Hydrocodone-Acetaminophen Other (See Comments)    NIGHT SWEATS  . Decongestant [Pseudoephedrine Hcl Er]     Unknown reaction  . Iodinated Diagnostic Agents Swelling  . Lisinopril     REACTION: Cough  . Procaine Hcl     REACTION: palpitations.  . Propoxyphene N-Acetaminophen   . Sulfonamide Derivatives     REACTION: Sweeling and rash    ROS Review of Systems    Objective:    Physical Exam  Constitutional: She is oriented to person, place, and time. She appears well-developed and well-nourished.  HENT:  Head: Normocephalic and atraumatic.  Cardiovascular: Normal rate, regular rhythm and normal heart sounds.  Pulmonary/Chest: Effort normal and breath sounds normal.  Neurological: She is alert and oriented to person, place, and time.  Skin: Skin is warm and dry.  Psychiatric: She has a normal mood and affect. Her behavior is normal.    BP 119/74   Pulse 88   Ht 5\' 2"  (1.575 m)   Wt 151 lb (68.5 kg)   SpO2 100%   BMI 27.62 kg/m  Wt Readings from Last 3 Encounters:  08/04/19 151 lb (68.5 kg)  02/03/19 148 lb (67.1 kg)  08/05/18 142 lb (64.4 kg)     There are no preventive care reminders to display for this patient.  There are no preventive care reminders to display for this patient.  Lab Results  Component Value Date   TSH 0.77 01/28/2019   Lab Results  Component Value Date   WBC 5.3 10/11/2017   HGB 14.2 10/11/2017   HCT 41.7 10/11/2017   MCV 86.3 10/11/2017   PLT 386 10/11/2017   Lab Results  Component Value Date   NA 140 02/03/2019   K 4.3 02/03/2019   CO2 27 02/03/2019   GLUCOSE 78 02/03/2019   BUN 12 02/03/2019   CREATININE 0.57 02/03/2019   BILITOT 0.5 02/03/2019   ALKPHOS 88 11/14/2016   AST 21 02/03/2019   ALT 44 (H) 02/03/2019   PROT 6.5  02/03/2019   ALBUMIN 3.8 11/14/2016   CALCIUM 9.0 02/03/2019   Lab Results  Component Value Date   CHOL 137 02/03/2019   Lab Results  Component Value Date   HDL 60 02/03/2019   Lab Results  Component Value Date   LDLCALC 63 02/03/2019   Lab Results  Component Value Date   TRIG 67 02/03/2019   Lab Results  Component Value Date   CHOLHDL 2.3 02/03/2019   Lab Results  Component Value Date   HGBA1C 5.6 02/03/2019      Assessment & Plan:   Problem List Items Addressed This Visit      Cardiovascular and Mediastinum   HYPERTENSION, BENIGN ESSENTIAL - Primary    Well controlled. Continue current regimen. Follow up in  6 mo. Check CR since on [facility].        Relevant Medications   losartan (COZAAR) 100 MG tablet   Other Relevant Orders   COMPLETE METABOLIC PANEL WITH GFR     Respiratory   Mild intermittent asthma    Just uses prn albuterol. Stable.         Other   Depression, major, single episode, complete remission (HCC)    Stable.  PHQ-9 of 3 and GAD-7 score of 2.  Continue current regimen with Cymbalta.  No changes or adjustments made today.  Follow-up in 6 months.      Relevant Medications   DULoxetine (CYMBALTA) 60 MG capsule    Other Visit Diagnoses    Elevated liver enzymes       Relevant Orders   COMPLETE METABOLIC PANEL WITH GFR      Labs in July showed borderline elevated liver enzyme.  We will recheck today.  Meds ordered this encounter  Medications  . DULoxetine (CYMBALTA) 60 MG capsule    Sig: TAKE 1 CAPSULE BY MOUTH EVERY DAY    Dispense:  90 capsule    Refill:  1  . losartan (COZAAR) 100 MG tablet    Sig: Take 1 tablet (100 mg total) by mouth daily.    Dispense:  90 tablet    Refill:  1    Follow-up: Return in about 6 months (around 02/01/2020) for Hypertension and Diabetes.    Beatrice Lecher, MD

## 2019-08-04 NOTE — Assessment & Plan Note (Signed)
Just uses prn albuterol. Stable.

## 2019-08-05 LAB — COMPLETE METABOLIC PANEL WITH GFR
AG Ratio: 2 (calc) (ref 1.0–2.5)
ALT: 131 U/L — ABNORMAL HIGH (ref 6–29)
AST: 58 U/L — ABNORMAL HIGH (ref 10–35)
Albumin: 4.4 g/dL (ref 3.6–5.1)
Alkaline phosphatase (APISO): 69 U/L (ref 37–153)
BUN: 16 mg/dL (ref 7–25)
CO2: 26 mmol/L (ref 20–32)
Calcium: 9.2 mg/dL (ref 8.6–10.4)
Chloride: 105 mmol/L (ref 98–110)
Creat: 0.51 mg/dL (ref 0.50–0.99)
GFR, Est African American: 121 mL/min/{1.73_m2} (ref >=60)
GFR, Est Non African American: 105 mL/min/{1.73_m2} (ref >=60)
Globulin: 2.2 g/dL (calc) (ref 1.9–3.7)
Glucose, Bld: 93 mg/dL (ref 65–99)
Potassium: 4.1 mmol/L (ref 3.5–5.3)
Sodium: 140 mmol/L (ref 135–146)
Total Bilirubin: 0.5 mg/dL (ref 0.2–1.2)
Total Protein: 6.6 g/dL (ref 6.1–8.1)

## 2019-08-06 ENCOUNTER — Other Ambulatory Visit: Payer: Self-pay | Admitting: *Deleted

## 2019-08-06 NOTE — Addendum Note (Signed)
Addended by: Beatrice Lecher D on: 08/06/2019 02:13 PM   Modules accepted: Orders

## 2019-08-14 ENCOUNTER — Ambulatory Visit (HOSPITAL_COMMUNITY)
Admission: RE | Admit: 2019-08-14 | Discharge: 2019-08-14 | Disposition: A | Discharge status: Home / Self Care | Payer: Managed Care, Other (non HMO) | Source: Ambulatory Visit | Attending: Family Medicine | Admitting: Family Medicine

## 2019-08-14 ENCOUNTER — Encounter (HOSPITAL_COMMUNITY): Payer: Self-pay

## 2019-08-14 ENCOUNTER — Other Ambulatory Visit: Payer: Self-pay

## 2019-08-14 DIAGNOSIS — R748 Abnormal levels of other serum enzymes: Secondary | ICD-10-CM

## 2019-08-20 ENCOUNTER — Other Ambulatory Visit: Payer: Self-pay | Admitting: *Deleted

## 2019-08-20 ENCOUNTER — Other Ambulatory Visit: Payer: Self-pay

## 2019-08-20 ENCOUNTER — Ambulatory Visit (HOSPITAL_COMMUNITY)
Admission: RE | Admit: 2019-08-20 | Discharge: 2019-08-20 | Disposition: A | Discharge status: Home / Self Care | Payer: Managed Care, Other (non HMO) | Source: Ambulatory Visit | Attending: Family Medicine | Admitting: Family Medicine

## 2019-08-20 DIAGNOSIS — R748 Abnormal levels of other serum enzymes: Secondary | ICD-10-CM

## 2019-09-12 LAB — HEPATIC FUNCTION PANEL
AG Ratio: 2 (calc) (ref 1.0–2.5)
ALT: 29 U/L (ref 6–29)
AST: 18 U/L (ref 10–35)
Albumin: 4.3 g/dL (ref 3.6–5.1)
Alkaline phosphatase (APISO): 62 U/L (ref 37–153)
Bilirubin, Direct: 0.1 mg/dL (ref 0.0–0.2)
Globulin: 2.2 g/dL (calc) (ref 1.9–3.7)
Indirect Bilirubin: 0.5 mg/dL (calc) (ref 0.2–1.2)
Total Bilirubin: 0.6 mg/dL (ref 0.2–1.2)
Total Protein: 6.5 g/dL (ref 6.1–8.1)

## 2019-10-30 LAB — HEMOGLOBIN A1C: Hemoglobin A1C: 5.5

## 2019-10-30 LAB — LIPID PANEL
Cholesterol: 119 (ref 0–200)
HDL: 45 (ref 35–70)
LDL Cholesterol: 58
Triglycerides: 79 (ref 40–160)

## 2019-10-30 LAB — BASIC METABOLIC PANEL: Glucose: 91

## 2019-11-10 ENCOUNTER — Telehealth: Payer: Self-pay

## 2019-11-10 NOTE — Telephone Encounter (Signed)
That is so awesome!  Please congratulate her on the weight loss and working hard.  Yes, go ahead and start cutting the losartan in half, which is 50 mg.  Or if she needs me to send over new prescription for 50 mg I can do that just let me know.  If she still feels like she is having low blood pressures on that dose then we can always decrease down to 25 mg if needed.

## 2019-11-10 NOTE — Telephone Encounter (Signed)
Sara Callahan reports she has been working out and dieting. She has lost about 20 lbs. She states she has notice feeling a little dizzy and her blood pressure has been low. 90/70. She wanted to know if she could cut the blood pressure medication in half. She declined an appointment.

## 2019-11-10 NOTE — Telephone Encounter (Signed)
Patient advised of recommendations.  

## 2019-12-22 ENCOUNTER — Other Ambulatory Visit: Payer: Self-pay

## 2019-12-22 ENCOUNTER — Ambulatory Visit (INDEPENDENT_AMBULATORY_CARE_PROVIDER_SITE_OTHER): Payer: Managed Care, Other (non HMO) | Admitting: Family Medicine

## 2019-12-22 ENCOUNTER — Encounter: Payer: Self-pay | Admitting: Family Medicine

## 2019-12-22 VITALS — BP 111/64 | HR 84 | Ht 62.0 in | Wt 160.0 lb

## 2019-12-22 DIAGNOSIS — Z0000 Encounter for general adult medical examination without abnormal findings: Secondary | ICD-10-CM

## 2019-12-22 DIAGNOSIS — Z23 Encounter for immunization: Secondary | ICD-10-CM

## 2019-12-22 DIAGNOSIS — R413 Other amnesia: Secondary | ICD-10-CM | POA: Diagnosis not present

## 2019-12-22 DIAGNOSIS — I10 Essential (primary) hypertension: Secondary | ICD-10-CM

## 2019-12-22 MED ORDER — LOSARTAN POTASSIUM 50 MG PO TABS: 50.0000 mg | ORAL_TABLET | Freq: Every day | ORAL | 2 refills | Status: DC

## 2019-12-22 NOTE — Addendum Note (Signed)
Addended by: Teddy Spike on: 12/22/2019 05:49 PM   Modules accepted: Orders

## 2019-12-22 NOTE — Patient Instructions (Signed)
Health Maintenance, Female Adopting a healthy lifestyle and getting preventive care are important in promoting health and wellness. Ask your health care provider about:  The right schedule for you to have regular tests and exams.  Things you can do on your own to prevent diseases and keep yourself healthy. What should I know about diet, weight, and exercise? Eat a healthy diet   Eat a diet that includes plenty of vegetables, fruits, low-fat dairy products, and lean protein.  Do not eat a lot of foods that are high in solid fats, added sugars, or sodium. Maintain a healthy weight Body mass index (BMI) is used to identify weight problems. It estimates body fat based on height and weight. Your health care provider can help determine your BMI and help you achieve or maintain a healthy weight. Get regular exercise Get regular exercise. This is one of the most important things you can do for your health. Most adults should:  Exercise for at least 150 minutes each week. The exercise should increase your heart rate and make you sweat (moderate-intensity exercise).  Do strengthening exercises at least twice a week. This is in addition to the moderate-intensity exercise.  Spend less time sitting. Even light physical activity can be beneficial. Watch cholesterol and blood lipids Have your blood tested for lipids and cholesterol at 61 years of age, then have this test every 5 years. Have your cholesterol levels checked more often if:  Your lipid or cholesterol levels are high.  You are older than 61 years of age.  You are at high risk for heart disease. What should I know about cancer screening? Depending on your health history and family history, you may need to have cancer screening at various ages. This may include screening for:  Breast cancer.  Cervical cancer.  Colorectal cancer.  Skin cancer.  Lung cancer. What should I know about heart disease, diabetes, and high blood  pressure? Blood pressure and heart disease  High blood pressure causes heart disease and increases the risk of stroke. This is more likely to develop in people who have high blood pressure readings, are of African descent, or are overweight.  Have your blood pressure checked: ? Every 3-5 years if you are 18-39 years of age. ? Every year if you are 40 years old or older. Diabetes Have regular diabetes screenings. This checks your fasting blood sugar level. Have the screening done:  Once every three years after age 40 if you are at a normal weight and have a low risk for diabetes.  More often and at a younger age if you are overweight or have a high risk for diabetes. What should I know about preventing infection? Hepatitis B If you have a higher risk for hepatitis B, you should be screened for this virus. Talk with your health care provider to find out if you are at risk for hepatitis B infection. Hepatitis C Testing is recommended for:  Everyone born from 1945 through 1965.  Anyone with known risk factors for hepatitis C. Sexually transmitted infections (STIs)  Get screened for STIs, including gonorrhea and chlamydia, if: ? You are sexually active and are younger than 61 years of age. ? You are older than 61 years of age and your health care provider tells you that you are at risk for this type of infection. ? Your sexual activity has changed since you were last screened, and you are at increased risk for chlamydia or gonorrhea. Ask your health care provider if   you are at risk.  Ask your health care provider about whether you are at high risk for HIV. Your health care provider may recommend a prescription medicine to help prevent HIV infection. If you choose to take medicine to prevent HIV, you should first get tested for HIV. You should then be tested every 3 months for as long as you are taking the medicine. Pregnancy  If you are about to stop having your period (premenopausal) and  you may become pregnant, seek counseling before you get pregnant.  Take 400 to 800 micrograms (mcg) of folic acid every day if you become pregnant.  Ask for birth control (contraception) if you want to prevent pregnancy. Osteoporosis and menopause Osteoporosis is a disease in which the bones lose minerals and strength with aging. This can result in bone fractures. If you are 65 years old or older, or if you are at risk for osteoporosis and fractures, ask your health care provider if you should:  Be screened for bone loss.  Take a calcium or vitamin D supplement to lower your risk of fractures.  Be given hormone replacement therapy (HRT) to treat symptoms of menopause. Follow these instructions at home: Lifestyle  Do not use any products that contain nicotine or tobacco, such as cigarettes, e-cigarettes, and chewing tobacco. If you need help quitting, ask your health care provider.  Do not use street drugs.  Do not share needles.  Ask your health care provider for help if you need support or information about quitting drugs. Alcohol use  Do not drink alcohol if: ? Your health care provider tells you not to drink. ? You are pregnant, may be pregnant, or are planning to become pregnant.  If you drink alcohol: ? Limit how much you use to 0-1 drink a day. ? Limit intake if you are breastfeeding.  Be aware of how much alcohol is in your drink. In the [place] one drink equals one 12 oz bottle of beer (355 mL), one 5 oz glass of wine (148 mL), or one 1 oz glass of hard liquor (44 mL). General instructions  Schedule regular health, dental, and eye exams.  Stay current with your vaccines.  Tell your health care provider if: ? You often feel depressed. ? You have ever been abused or do not feel safe at home. Summary  Adopting a healthy lifestyle and getting preventive care are important in promoting health and wellness.  Follow your health care provider's instructions about healthy  diet, exercising, and getting tested or screened for diseases.  Follow your health care provider's instructions on monitoring your cholesterol and blood pressure. This information is not intended to replace advice given to you by your health care provider. Make sure you discuss any questions you have with your health care provider. Document Revised: 07/10/2018 Document Reviewed: 07/10/2018 Elsevier Patient Education  2020 Elsevier Inc.  

## 2019-12-22 NOTE — Progress Notes (Signed)
Subjective:     Sara Callahan is a 59 y.o. female and is here for a comprehensive physical exam. The patient reports no problems.   Social History   Socioeconomic History  . Marital status: Widowed    Spouse name: Shanon Brow  . Number of children: 3  . Years of education: Not on file  . Highest education level: Not on file  Occupational History    Employer: [employer]    Comment: [employer]  Tobacco Use  . Smoking status: Never Smoker  . Smokeless tobacco: Never Used  Substance and Sexual Activity  . Alcohol use: Yes    Comment: "once or twice a year"  . Drug use: No  . Sexual activity: Yes    Partners: Male    Comment: [employer], 12 yrs educations, married, 3 kids.  Other Topics Concern  . Not on file  Social History Narrative   +  regular exercise. Takes her calcium supplement.    Social Determinants of Health   Financial Resource Strain:   . Difficulty of Paying Living Expenses:   Food Insecurity:   . Worried About Charity fundraiser in the Last Year:   . Arboriculturist in the Last Year:   Transportation Needs:   . Film/video editor (Medical):   Marland Kitchen Lack of Transportation (Non-Medical):   Physical Activity:   . Days of Exercise per Week:   . Minutes of Exercise per Session:   Stress:   . Feeling of Stress :   Social Connections:   . Frequency of Communication with Friends and Family:   . Frequency of Social Gatherings with Friends and Family:   . Attends Religious Services:   . Active Member of Clubs or Organizations:   . Attends Archivist Meetings:   Marland Kitchen Marital Status:   Intimate Partner Violence:   . Fear of Current or Ex-Partner:   . Emotionally Abused:   Marland Kitchen Physically Abused:   . Sexually Abused:    Health Maintenance  Topic Date Due  . TETANUS/TDAP  09/30/2019  . INFLUENZA VACCINE  02/29/2020  . COLONOSCOPY  11/18/2020  . MAMMOGRAM  01/19/2021  . COVID-19 Vaccine  Completed  . Hepatitis C Screening   Completed  . HIV Screening  Completed    The following portions of the patient's history were reviewed and updated as appropriate: allergies, current medications, past family history, past medical history, past social history, past surgical history and problem list.  Review of Systems A comprehensive review of systems was negative.   Objective:    BP 111/64   Pulse 84   Ht 5\' 2"  (1.575 m)   Wt 160 lb (72.6 kg)   SpO2 100%   BMI 29.26 kg/m  General appearance: alert, cooperative and appears stated age Head: Normocephalic, without obvious abnormality, atraumatic Eyes: conj clear, EOMi, PEERLA Ears: normal TM's and external ear canals both ears Nose: Nares normal. Septum midline. Mucosa normal. No drainage or sinus tenderness. Throat: lips, mucosa, and tongue normal; teeth and gums normal Neck: no adenopathy, no carotid bruit, no JVD, supple, symmetrical, trachea midline and thyroid not enlarged, symmetric, no tenderness/mass/nodules Back: symmetric, no curvature. ROM normal. No CVA tenderness. Lungs: clear to auscultation bilaterally Breasts: normal appearance, no masses or tenderness Heart: regular rate and rhythm, S1, S2 normal, no murmur, click, rub or gallop Abdomen: soft, non-tender; bowel sounds normal; no masses,  no organomegaly Extremities: extremities normal, atraumatic, no cyanosis  or edema Pulses: 2+ and symmetric Skin: Skin color, texture, turgor normal. No rashes or lesions Lymph nodes: Cervical, supraclavicular, and axillary nodes normal. Neurologic: Alert and oriented X 3, normal strength and tone. Normal symmetric reflexes. Normal coordination and gait    Assessment:    Healthy female exam.      Plan:     See After Visit Summary for Counseling Recommendations   Keep up a regular exercise program and make sure you are eating a healthy diet Try to eat 4 servings of dairy a day, or if you are lactose intolerant take a calcium with vitamin D daily.  Your  vaccines are up to date.  Tdap and sHingrix given today.

## 2019-12-23 LAB — COMPLETE METABOLIC PANEL WITH GFR
AG Ratio: 1.8 (calc) (ref 1.0–2.5)
ALT: 31 U/L — ABNORMAL HIGH (ref 6–29)
AST: 19 U/L (ref 10–35)
Albumin: 4.2 g/dL (ref 3.6–5.1)
Alkaline phosphatase (APISO): 65 U/L (ref 37–153)
BUN: 15 mg/dL (ref 7–25)
CO2: 26 mmol/L (ref 20–32)
Calcium: 9.3 mg/dL (ref 8.6–10.4)
Chloride: 105 mmol/L (ref 98–110)
Creat: 0.58 mg/dL (ref 0.50–0.99)
GFR, Est African American: 115 mL/min/{1.73_m2} (ref >=60)
GFR, Est Non African American: 99 mL/min/{1.73_m2} (ref >=60)
Globulin: 2.4 g/dL (calc) (ref 1.9–3.7)
Glucose, Bld: 95 mg/dL (ref 65–99)
Potassium: 4 mmol/L (ref 3.5–5.3)
Sodium: 140 mmol/L (ref 135–146)
Total Bilirubin: 0.5 mg/dL (ref 0.2–1.2)
Total Protein: 6.6 g/dL (ref 6.1–8.1)

## 2019-12-23 LAB — HIV ANTIBODY (ROUTINE TESTING W REFLEX): HIV 1&2 Ab, 4th Generation: NONREACTIVE

## 2019-12-23 LAB — CBC
HCT: 39.3 % (ref 35.0–45.0)
Hemoglobin: 12.6 g/dL (ref 11.7–15.5)
MCH: 29.3 pg (ref 27.0–33.0)
MCHC: 32.1 g/dL (ref 32.0–36.0)
MCV: 91.4 fL (ref 80.0–100.0)
MPV: 10.1 fL (ref 7.5–12.5)
Platelets: 392 10*3/uL (ref 140–400)
RBC: 4.3 10*6/uL (ref 3.80–5.10)
RDW: 13.4 % (ref 11.0–15.0)
WBC: 5.1 10*3/uL (ref 3.8–10.8)

## 2019-12-23 LAB — RPR: RPR Ser Ql: NONREACTIVE

## 2019-12-23 LAB — TSH: TSH: 1.05 mIU/L (ref 0.40–4.50)

## 2020-01-05 ENCOUNTER — Ambulatory Visit: Payer: Managed Care, Other (non HMO) | Admitting: Family Medicine

## 2020-01-22 LAB — HM MAMMOGRAPHY

## 2020-01-29 ENCOUNTER — Encounter: Payer: Self-pay | Admitting: Family Medicine

## 2020-03-10 ENCOUNTER — Encounter: Payer: Self-pay | Admitting: Family Medicine

## 2020-03-18 ENCOUNTER — Other Ambulatory Visit: Payer: Self-pay | Admitting: Family Medicine

## 2020-03-18 DIAGNOSIS — F325 Major depressive disorder, single episode, in full remission: Secondary | ICD-10-CM

## 2020-04-13 ENCOUNTER — Other Ambulatory Visit: Payer: Self-pay | Admitting: Family Medicine

## 2020-04-13 ENCOUNTER — Other Ambulatory Visit: Payer: Self-pay | Admitting: *Deleted

## 2020-04-13 DIAGNOSIS — J0100 Acute maxillary sinusitis, unspecified: Secondary | ICD-10-CM

## 2020-04-13 DIAGNOSIS — E7849 Other hyperlipidemia: Secondary | ICD-10-CM

## 2020-04-13 MED ORDER — FLUTICASONE PROPIONATE 50 MCG/ACT NA SUSP: NASAL | 2 refills | Status: DC

## 2020-09-10 ENCOUNTER — Other Ambulatory Visit: Payer: Self-pay | Admitting: Family Medicine

## 2020-09-10 DIAGNOSIS — F325 Major depressive disorder, single episode, in full remission: Secondary | ICD-10-CM

## 2020-09-15 ENCOUNTER — Other Ambulatory Visit: Payer: Self-pay | Admitting: Family Medicine

## 2020-09-15 DIAGNOSIS — I10 Essential (primary) hypertension: Secondary | ICD-10-CM

## 2020-09-16 ENCOUNTER — Other Ambulatory Visit: Payer: Self-pay | Admitting: *Deleted

## 2020-09-16 DIAGNOSIS — I10 Essential (primary) hypertension: Secondary | ICD-10-CM

## 2020-09-16 MED ORDER — LOSARTAN POTASSIUM 50 MG PO TABS: 50.0000 mg | ORAL_TABLET | Freq: Every day | ORAL | 2 refills | Status: DC

## 2021-01-17 ENCOUNTER — Encounter: Payer: Managed Care, Other (non HMO) | Admitting: Family Medicine

## 2021-02-07 ENCOUNTER — Ambulatory Visit (INDEPENDENT_AMBULATORY_CARE_PROVIDER_SITE_OTHER): Payer: Managed Care, Other (non HMO) | Admitting: Family Medicine

## 2021-02-07 ENCOUNTER — Encounter: Payer: Self-pay | Admitting: Family Medicine

## 2021-02-07 ENCOUNTER — Other Ambulatory Visit: Payer: Self-pay

## 2021-02-07 VITALS — BP 100/51 | HR 91 | Ht 62.0 in | Wt 126.0 lb

## 2021-02-07 DIAGNOSIS — Z1231 Encounter for screening mammogram for malignant neoplasm of breast: Secondary | ICD-10-CM

## 2021-02-07 DIAGNOSIS — Z0000 Encounter for general adult medical examination without abnormal findings: Secondary | ICD-10-CM | POA: Diagnosis not present

## 2021-02-07 MED ORDER — CLOBETASOL PROPIONATE 0.05 % EX SOLN: 1.0000 "application " | Freq: Every day | CUTANEOUS | 2 refills | Status: DC

## 2021-02-07 NOTE — Progress Notes (Signed)
Subjective:     Sara Callahan is a 60 y.o. female and is here for a comprehensive physical exam. The patient reports no problems. She works as a Orthoptist in health care.  No recent CP or SOB.  She has f/u with Derm next months.   Social History   Socioeconomic History   Marital status: Widowed    Spouse name: Shanon Brow   Number of children: 3   Years of education: Not on file   Highest education level: Not on file  Occupational History    Employer: [employer]    Comment: [employer]  Tobacco Use   Smoking status: Never   Smokeless tobacco: Never  Vaping Use   Vaping Use: Never used  Substance and Sexual Activity   Alcohol use: Yes    Comment: "once or twice a year"   Drug use: No   Sexual activity: Yes    Partners: Male    Comment: [employer], 12 yrs educations, married, 3 kids.  Other Topics Concern   Not on file  Social History Narrative   +  regular exercise. Takes her calcium supplement.    Social Determinants of Health   Financial Resource Strain: Not on file  Food Insecurity: Not on file  Transportation Needs: Not on file  Physical Activity: Not on file  Stress: Not on file  Social Connections: Not on file  Intimate Partner Violence: Not on file   Health Maintenance  Topic Date Due   Pneumococcal Vaccine 62-3 Years old (1 - PCV) Never done   COVID-19 Vaccine (3 - Booster for [employer] series) 09/04/2020   COLONOSCOPY (Pts 45-53yrs Insurance coverage will need to be confirmed)  11/18/2020   INFLUENZA VACCINE  02/28/2021   MAMMOGRAM  01/21/2022   TETANUS/TDAP  12/21/2029   Hepatitis C Screening  Completed   HIV Screening  Completed   Zoster Vaccines- Shingrix  Completed   HPV VACCINES  Aged Out    The following portions of the patient's history were reviewed and updated as appropriate: allergies, current medications, past family history, past medical history, past social history, past surgical history, and problem list.  Review  of Systems Pertinent items are noted in HPI.   Objective:    BP (!) 100/51   Pulse 91   Ht 5\' 2"  (1.575 m)   Wt 126 lb (57.2 kg)   SpO2 100%   BMI 23.05 kg/m  General appearance: alert, cooperative, and appears stated age Head: Normocephalic, without obvious abnormality, atraumatic Eyes:  conj clear, EOMi, PEERLA Ears: normal TM's and external ear canals both ears Nose: Nares normal. Septum midline. Mucosa normal. No drainage or sinus tenderness. Throat: lips, mucosa, and tongue normal; teeth and gums normal Neck: no adenopathy, no carotid bruit, no JVD, supple, symmetrical, trachea midline, and thyroid not enlarged, symmetric, no tenderness/mass/nodules Back: symmetric, no curvature. ROM normal. No CVA tenderness. Lungs: clear to auscultation bilaterally Breasts: normal appearance, no masses or tenderness Heart: regular rate and rhythm, S1, S2 normal, no murmur, click, rub or gallop Abdomen: soft, non-tender; bowel sounds normal; no masses,  no organomegaly Extremities: extremities normal, atraumatic, no cyanosis or edema Pulses: 2+ and symmetric Skin: Skin color, texture, turgor normal. No rashes or lesions Lymph nodes: Cervical, supraclavicular, and axillary nodes normal. Neurologic: Grossly normal    Assessment:    Healthy female exam.      Plan:     See After Visit Summary for Counseling Recommendations  Keep up  a regular exercise program and make sure you are eating a healthy diet Try to eat 4 servings of dairy a day, or if you are lactose intolerant take a calcium with vitamin D daily.  Your vaccines are up to date.   Orders Placed This Encounter  Procedures   MM 3D SCREEN BREAST BILATERAL    Scheduling Instructions:     Please call pt to schedule    Order Specific Question:   Reason for Exam (SYMPTOM  OR DIAGNOSIS REQUIRED)    Answer:   breast cancer screening    Order Specific Question:   Preferred imaging location?    Answer:   Product/process development scientist    COMPLETE METABOLIC PANEL WITH GFR   Lipid panel    Order Specific Question:   Has the patient fasted?    Answer:   Yes   CBC   HgB A1c

## 2021-02-08 LAB — CBC
HCT: 45 % (ref 35.0–45.0)
Hemoglobin: 14.8 g/dL (ref 11.7–15.5)
MCH: 30 pg (ref 27.0–33.0)
MCHC: 32.9 g/dL (ref 32.0–36.0)
MCV: 91.1 fL (ref 80.0–100.0)
MPV: 10.2 fL (ref 7.5–12.5)
Platelets: 402 10*3/uL — ABNORMAL HIGH (ref 140–400)
RBC: 4.94 10*6/uL (ref 3.80–5.10)
RDW: 13.2 % (ref 11.0–15.0)
WBC: 5.6 10*3/uL (ref 3.8–10.8)

## 2021-02-08 LAB — COMPLETE METABOLIC PANEL WITH GFR
AG Ratio: 1.9 (calc) (ref 1.0–2.5)
ALT: 23 U/L (ref 6–29)
AST: 26 U/L (ref 10–35)
Albumin: 4.6 g/dL (ref 3.6–5.1)
Alkaline phosphatase (APISO): 60 U/L (ref 37–153)
BUN: 14 mg/dL (ref 7–25)
CO2: 26 mmol/L (ref 20–32)
Calcium: 9.7 mg/dL (ref 8.6–10.4)
Chloride: 101 mmol/L (ref 98–110)
Creat: 0.63 mg/dL (ref 0.50–1.05)
Globulin: 2.4 g/dL (calc) (ref 1.9–3.7)
Glucose, Bld: 87 mg/dL (ref 65–99)
Potassium: 4.6 mmol/L (ref 3.5–5.3)
Sodium: 138 mmol/L (ref 135–146)
Total Bilirubin: 0.6 mg/dL (ref 0.2–1.2)
Total Protein: 7 g/dL (ref 6.1–8.1)
eGFR: 100 mL/min/{1.73_m2} (ref >=60)

## 2021-02-08 LAB — LIPID PANEL
Cholesterol: 166 mg/dL (ref <200)
HDL: 63 mg/dL (ref >=50)
LDL Cholesterol (Calc): 86 mg/dL (calc)
Non-HDL Cholesterol (Calc): 103 mg/dL (calc) (ref <130)
Total CHOL/HDL Ratio: 2.6 (calc) (ref <5.0)
Triglycerides: 82 mg/dL (ref <150)

## 2021-02-08 LAB — HEMOGLOBIN A1C
Hgb A1c MFr Bld: 5.3 % of total Hgb (ref <5.7)
Mean Plasma Glucose: 105 mg/dL
eAG (mmol/L): 5.8 mmol/L

## 2021-02-21 ENCOUNTER — Other Ambulatory Visit: Payer: Self-pay | Admitting: Family Medicine

## 2021-02-21 DIAGNOSIS — F325 Major depressive disorder, single episode, in full remission: Secondary | ICD-10-CM

## 2021-02-25 LAB — HM MAMMOGRAPHY

## 2021-04-17 ENCOUNTER — Other Ambulatory Visit: Payer: Self-pay | Admitting: Family Medicine

## 2021-04-17 DIAGNOSIS — I10 Essential (primary) hypertension: Secondary | ICD-10-CM

## 2021-04-29 ENCOUNTER — Other Ambulatory Visit: Payer: Self-pay | Admitting: Family Medicine

## 2021-04-29 DIAGNOSIS — E7849 Other hyperlipidemia: Secondary | ICD-10-CM

## 2021-06-04 ENCOUNTER — Other Ambulatory Visit: Payer: Self-pay | Admitting: Family Medicine

## 2021-06-04 DIAGNOSIS — J0100 Acute maxillary sinusitis, unspecified: Secondary | ICD-10-CM

## 2021-10-26 ENCOUNTER — Other Ambulatory Visit: Payer: Self-pay | Admitting: Family Medicine

## 2021-10-26 DIAGNOSIS — F325 Major depressive disorder, single episode, in full remission: Secondary | ICD-10-CM

## 2021-12-09 ENCOUNTER — Other Ambulatory Visit: Payer: Self-pay

## 2021-12-09 DIAGNOSIS — J0100 Acute maxillary sinusitis, unspecified: Secondary | ICD-10-CM

## 2021-12-09 MED ORDER — FLUTICASONE PROPIONATE 50 MCG/ACT NA SUSP: NASAL | 2 refills | Status: DC

## 2022-01-30 ENCOUNTER — Other Ambulatory Visit: Payer: Self-pay

## 2022-01-30 DIAGNOSIS — F325 Major depressive disorder, single episode, in full remission: Secondary | ICD-10-CM

## 2022-01-30 DIAGNOSIS — E7849 Other hyperlipidemia: Secondary | ICD-10-CM

## 2022-01-30 DIAGNOSIS — I10 Essential (primary) hypertension: Secondary | ICD-10-CM

## 2022-01-30 MED ORDER — DULOXETINE HCL 60 MG PO CPEP: 60.0000 mg | ORAL_CAPSULE | Freq: Every day | ORAL | 1 refills | Status: DC

## 2022-01-30 MED ORDER — LOSARTAN POTASSIUM 50 MG PO TABS: 50.0000 mg | ORAL_TABLET | Freq: Every day | ORAL | 1 refills | Status: DC

## 2022-01-30 MED ORDER — ATORVASTATIN CALCIUM 40 MG PO TABS: 40.0000 mg | ORAL_TABLET | Freq: Every day | ORAL | 3 refills | Status: DC

## 2022-02-02 LAB — LIPID PANEL
Cholesterol: 195 (ref 0–200)
HDL: 63 (ref 35–70)
LDL Cholesterol: 109
Triglycerides: 130 (ref 40–160)

## 2022-02-02 LAB — HEMOGLOBIN A1C: Hemoglobin A1C: 5.7

## 2022-02-02 LAB — BASIC METABOLIC PANEL: Glucose: 117

## 2022-02-13 ENCOUNTER — Ambulatory Visit (INDEPENDENT_AMBULATORY_CARE_PROVIDER_SITE_OTHER): Payer: 59 | Admitting: Family Medicine

## 2022-02-13 ENCOUNTER — Encounter: Payer: Self-pay | Admitting: Family Medicine

## 2022-02-13 VITALS — BP 110/66 | HR 76 | Ht 62.0 in | Wt 133.0 lb

## 2022-02-13 DIAGNOSIS — Z0000 Encounter for general adult medical examination without abnormal findings: Secondary | ICD-10-CM | POA: Diagnosis not present

## 2022-02-13 DIAGNOSIS — I10 Essential (primary) hypertension: Secondary | ICD-10-CM

## 2022-02-13 NOTE — Progress Notes (Signed)
Complete physical exam  Patient: Sara Callahan   DOB: 1961-06-26   61 y.o. Female  MRN: [id number]  Subjective:    Chief Complaint  Patient presents with   Annual Exam    Sara Callahan is a 61 y.o. female who presents today for a complete physical exam. She reports consuming a general diet.  Works ouit 5 days per week   She generally feels well. . She does not have additional problems to discuss today.  She did bring in some labs that were done through work including a lipid panel and an A1c.  A1c was 5.7.  Her mother is now staying with her after her mom had a recent fall and broke her shoulder.  She is been with her for at least 6 weeks at this point.  Most recent fall risk assessment:    02/13/2022    7:58 AM  Fall Risk   Falls in the past year? 0  Number falls in past yr: 0  Injury with Fall? 0  Risk for fall due to : No Fall Risks  Follow up Falls prevention discussed     Most recent depression screenings:    02/13/2022    7:58 AM 02/07/2021    7:48 AM  PHQ 2/9 Scores  PHQ - 2 Score 0 2  PHQ- 9 Score 4 7         Patient Care Team: Hali Marry, MD as PCP - General   Outpatient Medications Prior to Visit  Medication Sig   albuterol (PROVENTIL HFA;VENTOLIN HFA) 108 (90 Base) MCG/ACT inhaler Inhale 1-2 puffs into the lungs every 4 (four) hours as needed for wheezing or shortness of breath.   atorvastatin (LIPITOR) 40 MG tablet Take 1 tablet (40 mg total) by mouth daily at 6 PM.   clobetasol (TEMOVATE) 0.05 % external solution Apply 1 application topically at bedtime. Give skin a break after 2 weeks.   DULoxetine (CYMBALTA) 60 MG capsule Take 1 capsule (60 mg total) by mouth daily.   fluticasone (FLONASE) 50 MCG/ACT nasal spray SPRAY 1 SPRAY INTO EACH NOSTRIL TWICE A DAY   losartan (COZAAR) 50 MG tablet Take 1 tablet (50 mg total) by mouth daily.   No facility-administered medications prior to visit.    ROS        Objective:     BP 110/66    Pulse 76   Ht '5\' 2"'$  (1.575 m)   Wt 133 lb (60.3 kg)   SpO2 98%   BMI 24.33 kg/m     Physical Exam Vitals reviewed.  Constitutional:      Appearance: She is well-developed.  HENT:     Head: Normocephalic and atraumatic.     Right Ear: External ear normal.     Left Ear: External ear normal.     Nose: Nose normal.  Eyes:     Conjunctiva/sclera: Conjunctivae normal.     Pupils: Pupils are equal, round, and reactive to light.  Neck:     Thyroid: No thyromegaly.  Cardiovascular:     Rate and Rhythm: Normal rate and regular rhythm.     Heart sounds: Normal heart sounds.  Pulmonary:     Effort: Pulmonary effort is normal.     Breath sounds: Normal breath sounds. No wheezing.  Musculoskeletal:     Cervical back: Neck supple.  Lymphadenopathy:     Cervical: No cervical adenopathy.  Skin:    General: Skin is warm and dry.  Coloration: Skin is not pale.  Neurological:     Mental Status: She is alert and oriented to person, place, and time.  Psychiatric:        Behavior: Behavior normal.      No results found for any visits on 02/13/22.      Assessment & Plan:    Routine Health Maintenance and Physical Exam  Immunization History  Administered Date(s) Administered   Influenza Inj Mdck Quad Pf 04/25/2019   Influenza Split 04/26/2012   Influenza, Seasonal, Injecte, Preservative Fre 05/20/2013   Influenza,inj,Quad PF,6+ Mos 04/26/2017, 05/08/2018, 05/17/2020, 05/07/2021   Influenza-Unspecified 05/18/2014, 05/11/2015, 05/10/2016, 04/26/2017   Janssen (J&J) SARS-COV-2 Vaccination 11/03/2019   Moderna Covid-19 Vaccine Bivalent Booster 52yr & up 06/03/2021   Moderna Sars-Covid-2 Vaccination 07/10/2020   Td 09/29/2009   Tdap 12/22/2019   Zoster Recombinat (Shingrix) 02/03/2019, 12/22/2019    Health Maintenance  Topic Date Due   INFLUENZA VACCINE  02/28/2022   MAMMOGRAM  02/26/2023   COLONOSCOPY (Pts 45-441yrInsurance coverage will need to be confirmed)  11/18/2025    TETANUS/TDAP  12/21/2029   COVID-19 Vaccine  Completed   Hepatitis C Screening  Completed   HIV Screening  Completed   Zoster Vaccines- Shingrix  Completed   Pneumococcal Vaccine 197411ears old  Aged Out   HPV VACCINES  Aged Out    Discussed health benefits of physical activity, and encouraged her to engage in regular exercise appropriate for her age and condition.  Problem List Items Addressed This Visit       Cardiovascular and Mediastinum   HYPERTENSION, BENIGN ESSENTIAL   Other Visit Diagnoses     Wellness examination    -  Primary   Relevant Orders   COMPLETE METABOLIC PANEL WITH GFR   CBC with Differential/Platelet      Return in about 1 year (around 02/14/2023) for Wellness Exam.  Keep up a regular exercise program and make sure you are eating a healthy diet Try to eat 4 servings of dairy a day, or if you are lactose intolerant take a calcium with vitamin D daily.  Your vaccines are up to date.  A1c borderline at 5.7.  We do want a keep an eye on this encouraged her to cut back on sweets and carbs.     CaBeatrice LecherMD

## 2022-02-14 LAB — CBC WITH DIFFERENTIAL/PLATELET
Absolute Monocytes: 310 cells/uL (ref 200–950)
Basophils Absolute: 42 cells/uL (ref 0–200)
Basophils Relative: 0.9 %
Eosinophils Absolute: 221 cells/uL (ref 15–500)
Eosinophils Relative: 4.7 %
HCT: 46.8 % — ABNORMAL HIGH (ref 35.0–45.0)
Hemoglobin: 15.1 g/dL (ref 11.7–15.5)
Lymphs Abs: 1546 cells/uL (ref 850–3900)
MCH: 30.2 pg (ref 27.0–33.0)
MCHC: 32.3 g/dL (ref 32.0–36.0)
MCV: 93.6 fL (ref 80.0–100.0)
MPV: 10.3 fL (ref 7.5–12.5)
Monocytes Relative: 6.6 %
Neutro Abs: 2580 cells/uL (ref 1500–7800)
Neutrophils Relative %: 54.9 %
Platelets: 324 10*3/uL (ref 140–400)
RBC: 5 10*6/uL (ref 3.80–5.10)
RDW: 12.6 % (ref 11.0–15.0)
Total Lymphocyte: 32.9 %
WBC: 4.7 10*3/uL (ref 3.8–10.8)

## 2022-02-14 LAB — COMPLETE METABOLIC PANEL WITH GFR
AG Ratio: 2.2 (calc) (ref 1.0–2.5)
ALT: 45 U/L — ABNORMAL HIGH (ref 6–29)
AST: 26 U/L (ref 10–35)
Albumin: 4.6 g/dL (ref 3.6–5.1)
Alkaline phosphatase (APISO): 53 U/L (ref 37–153)
BUN: 18 mg/dL (ref 7–25)
CO2: 27 mmol/L (ref 20–32)
Calcium: 9.5 mg/dL (ref 8.6–10.4)
Chloride: 105 mmol/L (ref 98–110)
Creat: 0.67 mg/dL (ref 0.50–1.05)
Globulin: 2.1 g/dL (calc) (ref 1.9–3.7)
Glucose, Bld: 91 mg/dL (ref 65–99)
Potassium: 4.3 mmol/L (ref 3.5–5.3)
Sodium: 140 mmol/L (ref 135–146)
Total Bilirubin: 0.4 mg/dL (ref 0.2–1.2)
Total Protein: 6.7 g/dL (ref 6.1–8.1)
eGFR: 98 mL/min/{1.73_m2} (ref >=60)

## 2022-02-14 NOTE — Progress Notes (Signed)
Felecia Shelling, The ALT liver enzyme is slightly elevated at 45 and had gone back down to normal but it is jumped back up again.  Continue to work on healthy diet regular exercise and minimizing alcohol in the diet.  Blood count is normal.  Platelets are back to normal.

## 2022-03-15 LAB — HM MAMMOGRAPHY

## 2022-03-23 ENCOUNTER — Other Ambulatory Visit: Payer: Self-pay

## 2022-03-23 DIAGNOSIS — F325 Major depressive disorder, single episode, in full remission: Secondary | ICD-10-CM

## 2022-03-23 DIAGNOSIS — I10 Essential (primary) hypertension: Secondary | ICD-10-CM

## 2022-03-23 DIAGNOSIS — E7849 Other hyperlipidemia: Secondary | ICD-10-CM

## 2022-03-23 MED ORDER — ATORVASTATIN CALCIUM 40 MG PO TABS: 40.0000 mg | ORAL_TABLET | Freq: Every day | ORAL | 3 refills | Status: DC

## 2022-03-23 MED ORDER — DULOXETINE HCL 60 MG PO CPEP: 60.0000 mg | ORAL_CAPSULE | Freq: Every day | ORAL | 1 refills | Status: DC

## 2022-03-23 MED ORDER — LOSARTAN POTASSIUM 50 MG PO TABS: 50.0000 mg | ORAL_TABLET | Freq: Every day | ORAL | 1 refills | Status: DC

## 2022-04-15 ENCOUNTER — Encounter: Payer: Self-pay | Admitting: Family Medicine

## 2022-04-27 ENCOUNTER — Other Ambulatory Visit: Payer: Self-pay

## 2022-04-27 DIAGNOSIS — F325 Major depressive disorder, single episode, in full remission: Secondary | ICD-10-CM

## 2022-04-27 DIAGNOSIS — I10 Essential (primary) hypertension: Secondary | ICD-10-CM

## 2022-04-27 DIAGNOSIS — E7849 Other hyperlipidemia: Secondary | ICD-10-CM

## 2022-04-27 MED ORDER — ATORVASTATIN CALCIUM 40 MG PO TABS: 40.0000 mg | ORAL_TABLET | Freq: Every day | ORAL | 3 refills | Status: DC

## 2022-04-27 MED ORDER — DULOXETINE HCL 60 MG PO CPEP: 60.0000 mg | ORAL_CAPSULE | Freq: Every day | ORAL | 1 refills | Status: DC

## 2022-04-27 MED ORDER — LOSARTAN POTASSIUM 50 MG PO TABS: 50.0000 mg | ORAL_TABLET | Freq: Every day | ORAL | 1 refills | Status: DC

## 2022-10-10 ENCOUNTER — Other Ambulatory Visit: Payer: Self-pay | Admitting: Family Medicine

## 2022-10-10 DIAGNOSIS — I10 Essential (primary) hypertension: Secondary | ICD-10-CM

## 2022-10-22 ENCOUNTER — Other Ambulatory Visit: Payer: Self-pay | Admitting: Family Medicine

## 2022-10-22 DIAGNOSIS — F325 Major depressive disorder, single episode, in full remission: Secondary | ICD-10-CM

## 2022-12-15 ENCOUNTER — Encounter: Payer: Self-pay | Admitting: Family Medicine

## 2022-12-15 ENCOUNTER — Ambulatory Visit (INDEPENDENT_AMBULATORY_CARE_PROVIDER_SITE_OTHER): Payer: No Typology Code available for payment source | Admitting: Family Medicine

## 2022-12-15 ENCOUNTER — Telehealth: Payer: Self-pay

## 2022-12-15 VITALS — BP 133/79 | HR 101 | Ht 61.0 in | Wt 141.0 lb

## 2022-12-15 DIAGNOSIS — L409 Psoriasis, unspecified: Secondary | ICD-10-CM | POA: Diagnosis not present

## 2022-12-15 MED ORDER — CLOBETASOL PROPIONATE 0.05 % EX SHAM: MEDICATED_SHAMPOO | CUTANEOUS | 0 refills | Status: AC

## 2022-12-15 MED ORDER — PREDNISONE 20 MG PO TABS: 20.0000 mg | ORAL_TABLET | Freq: Every day | ORAL | 0 refills | Status: AC

## 2022-12-15 MED ORDER — CLOBETASOL PROPIONATE 0.05 % EX SOLN: 1.0000 | Freq: Every day | CUTANEOUS | 2 refills | Status: AC

## 2022-12-15 MED ORDER — BETAMETHASONE DIPROPIONATE 0.05 % EX CREA: TOPICAL_CREAM | Freq: Two times a day (BID) | CUTANEOUS | 0 refills | Status: AC

## 2022-12-15 MED ORDER — BETAMETHASONE DIPROPIONATE 0.05 % EX CREA: TOPICAL_CREAM | Freq: Two times a day (BID) | CUTANEOUS | 0 refills | Status: DC

## 2022-12-15 MED ORDER — KETOCONAZOLE 2 % EX SHAM: 1.0000 | MEDICATED_SHAMPOO | CUTANEOUS | 0 refills | Status: AC

## 2022-12-15 NOTE — Assessment & Plan Note (Signed)
-   pt in inflammed flare; have sent referral to derm to see if we can get patient in sooner - have gone ahead and sent in betamethasone cream for patient  - also did a course of steroids for patient due to severe itching  - recommended daily emollient use

## 2022-12-15 NOTE — Telephone Encounter (Signed)
[facility] pharmacy called and state the Clobetasol shampoo is not covered by her insurance. They recommend Ketoconazole shampoo.

## 2022-12-15 NOTE — Telephone Encounter (Signed)
Shampoo sent to pharmacy

## 2022-12-15 NOTE — Progress Notes (Signed)
Acute Office Visit  Subjective:     Patient ID: Sara Callahan, female    DOB: 04/25/1961, 62 y.o.   MRN: [id number]  Chief Complaint  Patient presents with   Psoriasis    HPI Patient is in today for psoriasis flare up. She has been trying clobetasol, otc psoriasis cream and shampoo. She is currently inflammed and having itching as well.   She has an apt with derm in July  Review of Systems  Constitutional:  Negative for chills and fever.  Respiratory:  Negative for cough and shortness of breath.   Cardiovascular:  Negative for chest pain.  Skin:  Positive for itching and rash.  Neurological:  Negative for headaches.        Objective:    BP 133/79   Pulse (!) 101   Ht 5\' 1"  (1.549 m)   Wt 141 lb (64 kg)   SpO2 99%   BMI 26.64 kg/m    Physical Exam Vitals and nursing note reviewed.  Constitutional:      General: She is not in acute distress.    Appearance: Normal appearance.  HENT:     Head: Normocephalic and atraumatic.     Right Ear: External ear normal.     Left Ear: External ear normal.     Nose: Nose normal.  Eyes:     Conjunctiva/sclera: Conjunctivae normal.  Cardiovascular:     Rate and Rhythm: Normal rate and regular rhythm.  Pulmonary:     Effort: Pulmonary effort is normal.     Breath sounds: Normal breath sounds.  Neurological:     General: No focal deficit present.     Mental Status: She is alert and oriented to person, place, and time.  Psychiatric:        Mood and Affect: Mood normal.        Behavior: Behavior normal.        Thought Content: Thought content normal.        Judgment: Judgment normal.     No results found for any visits on 12/15/22.      Assessment & Plan:   Problem List Items Addressed This Visit       Musculoskeletal and Integument   Psoriasis - Primary    - pt in inflammed flare; have sent referral to derm to see if we can get patient in sooner - have gone ahead and sent in betamethasone cream for patient   - also did a course of steroids for patient due to severe itching  - recommended daily emollient use       Relevant Medications   Clobetasol Propionate (CLOBEX) 0.05 % shampoo   predniSONE (DELTASONE) 20 MG tablet   betamethasone dipropionate 0.05 % cream   Other Relevant Orders   Ambulatory referral to Dermatology    Meds ordered this encounter  Medications   DISCONTD: betamethasone dipropionate 0.05 % cream    Sig: Apply topically 2 (two) times daily.    Dispense:  30 g    Refill:  0   Clobetasol Propionate (CLOBEX) 0.05 % shampoo    Sig: Apply topically to dry or wet scalp daily. Wait then rinse off.    Dispense:  118 mL    Refill:  0   predniSONE (DELTASONE) 20 MG tablet    Sig: Take 1 tablet (20 mg total) by mouth daily with breakfast for 5 days.    Dispense:  5 tablet    Refill:  0   clobetasol (  TEMOVATE) 0.05 % external solution    Sig: Apply 1 Application topically at bedtime. Give skin a break after 2 weeks.    Dispense:  50 mL    Refill:  2   betamethasone dipropionate 0.05 % cream    Sig: Apply topically 2 (two) times daily.    Dispense:  45 g    Refill:  0    Can order larger size    No follow-ups on file.  Charlton Amor, DO

## 2023-01-16 ENCOUNTER — Other Ambulatory Visit: Payer: Self-pay | Admitting: Family Medicine

## 2023-01-16 DIAGNOSIS — E7849 Other hyperlipidemia: Secondary | ICD-10-CM

## 2023-02-19 ENCOUNTER — Encounter: Payer: 59 | Admitting: Family Medicine

## 2023-02-19 DIAGNOSIS — Z0000 Encounter for general adult medical examination without abnormal findings: Secondary | ICD-10-CM

## 2023-02-19 NOTE — Progress Notes (Deleted)
Complete physical exam  Patient: Sara Callahan   DOB: [birth year]   62 y.o. Female  MRN: [id number]  Subjective:    No chief complaint on file.   Sara Callahan is a 62 y.o. female who presents today for a complete physical exam. She reports consuming a {diet types:17450} diet. {types:19826} She generally feels {DESC; WELL/FAIRLY WELL/POORLY:18703}. She reports sleeping {DESC; WELL/FAIRLY WELL/POORLY:18703}. She {does/does not:200015} have additional problems to discuss today.    Most recent fall risk assessment:    02/13/2022    7:58 AM  Fall Risk   Falls in the past year? 0  Number falls in past yr: 0  Injury with Fall? 0  Risk for fall due to : No Fall Risks  Follow up Falls prevention discussed     Most recent depression screenings:    02/13/2022    7:58 AM 02/07/2021    7:48 AM  PHQ 2/9 Scores  PHQ - 2 Score 0 2  PHQ- 9 Score 4 7    {VISON DENTAL STD PSA (Optional):27386}  {History (Optional):23778}  Patient Care Team: Agapito Games, MD as PCP - General   Outpatient Medications Prior to Visit  Medication Sig   ketoconazole (NIZORAL) 2 % shampoo Apply 1 Application topically 2 (two) times a week.   albuterol (PROVENTIL HFA;VENTOLIN HFA) 108 (90 Base) MCG/ACT inhaler Inhale 1-2 puffs into the lungs every 4 (four) hours as needed for wheezing or shortness of breath.   atorvastatin (LIPITOR) 40 MG tablet TAKE 1 TABLET BY MOUTH DAILY AT  6 PM   betamethasone dipropionate 0.05 % cream Apply topically 2 (two) times daily.   clobetasol (TEMOVATE) 0.05 % external solution Apply 1 Application topically at bedtime. Give skin a break after 2 weeks.   Clobetasol Propionate (CLOBEX) 0.05 % shampoo Apply topically to dry or wet scalp daily. Wait then rinse off.   DULoxetine (CYMBALTA) 60 MG capsule TAKE 1 CAPSULE BY MOUTH DAILY   fluticasone (FLONASE) 50 MCG/ACT nasal spray SPRAY 1 SPRAY INTO EACH NOSTRIL TWICE A DAY   losartan (COZAAR) 50 MG tablet TAKE 1  TABLET BY MOUTH DAILY   No facility-administered medications prior to visit.    ROS        Objective:     There were no vitals taken for this visit. {Vitals History (Optional):23777}  Physical Exam   No results found for any visits on 02/19/23. {Show previous labs (optional):23779}    Assessment & Plan:    Routine Health Maintenance and Physical Exam  Immunization History  Administered Date(s) Administered   Influenza Inj Mdck Quad Pf 04/25/2019   Influenza Split 04/26/2012   Influenza, Seasonal, Injecte, Preservative Fre 05/20/2013   Influenza,inj,Quad PF,6+ Mos 04/26/2017, 05/08/2018, 05/17/2020, 05/07/2021   Influenza-Unspecified 05/18/2014, 05/11/2015, 05/10/2016, 04/26/2017   Janssen (J&J) SARS-COV-2 Vaccination 11/03/2019   Moderna Covid-19 Vaccine Bivalent Booster 56yrs & up 06/03/2021   Moderna Sars-Covid-2 Vaccination 07/10/2020   Td 09/29/2009   Tdap 12/22/2019   Zoster Recombinant(Shingrix) 02/03/2019, 12/22/2019    Health Maintenance  Topic Date Due   COVID-19 Vaccine (4 - 2023-24 season) 03/31/2022   INFLUENZA VACCINE  03/01/2023   MAMMOGRAM  03/15/2024   Colonoscopy  11/18/2025   DTaP/Tdap/Td (3 - Td or Tdap) 12/21/2029   Hepatitis C Screening  Completed   HIV Screening  Completed   Zoster Vaccines- Shingrix  Completed   Pneumococcal Vaccine 5-37 Years old  Aged Out   HPV VACCINES  Aged Out  Discussed health benefits of physical activity, and encouraged her to engage in regular exercise appropriate for her age and condition.  Problem List Items Addressed This Visit   None Visit Diagnoses     Wellness examination    -  Primary      No follow-ups on file.     Sara Gasser, MD

## 2023-03-19 ENCOUNTER — Other Ambulatory Visit: Payer: Self-pay | Admitting: Family Medicine

## 2023-03-19 DIAGNOSIS — I10 Essential (primary) hypertension: Secondary | ICD-10-CM

## 2023-03-20 LAB — HM MAMMOGRAPHY

## 2023-03-26 ENCOUNTER — Other Ambulatory Visit: Payer: Self-pay

## 2023-03-26 ENCOUNTER — Telehealth: Payer: Self-pay | Admitting: Family Medicine

## 2023-03-26 DIAGNOSIS — R928 Other abnormal and inconclusive findings on diagnostic imaging of breast: Secondary | ICD-10-CM

## 2023-03-26 LAB — HM MAMMOGRAPHY

## 2023-03-26 NOTE — Telephone Encounter (Signed)
Sara Callahan from [facility] she requested an order for right diagnostic mammogram and ultrasound of the right breast please fax to [phone]  [phone] contact phone number

## 2023-03-26 NOTE — Progress Notes (Signed)
Ordered diagnostic mammogram for [facility].

## 2023-03-26 NOTE — Telephone Encounter (Signed)
Faxed order

## 2023-04-11 ENCOUNTER — Encounter: Payer: Self-pay | Admitting: Family Medicine

## 2023-04-19 ENCOUNTER — Encounter: Payer: Self-pay | Admitting: Family Medicine

## 2023-06-21 ENCOUNTER — Other Ambulatory Visit: Payer: Self-pay | Admitting: Family Medicine

## 2023-06-21 DIAGNOSIS — J0100 Acute maxillary sinusitis, unspecified: Secondary | ICD-10-CM

## 2023-08-14 ENCOUNTER — Ambulatory Visit (INDEPENDENT_AMBULATORY_CARE_PROVIDER_SITE_OTHER): Payer: No Typology Code available for payment source | Admitting: Family Medicine

## 2023-08-14 ENCOUNTER — Other Ambulatory Visit: Payer: Self-pay | Admitting: Family Medicine

## 2023-08-14 ENCOUNTER — Encounter: Payer: Self-pay | Admitting: Family Medicine

## 2023-08-14 VITALS — BP 128/68 | HR 91 | Ht 61.0 in | Wt 146.0 lb

## 2023-08-14 DIAGNOSIS — F5101 Primary insomnia: Secondary | ICD-10-CM

## 2023-08-14 DIAGNOSIS — G5602 Carpal tunnel syndrome, left upper limb: Secondary | ICD-10-CM

## 2023-08-14 MED ORDER — TRAZODONE HCL 50 MG PO TABS
25.0000 mg | ORAL_TABLET | Freq: Every evening | ORAL | 3 refills | Status: DC | PRN
Start: 2023-08-14 — End: 2023-10-04

## 2023-08-14 NOTE — Progress Notes (Signed)
 Acute Office Visit  Subjective:     Patient ID: Sara Callahan, female    DOB: [birth year], 63 y.o.   MRN: [id number]  Chief Complaint  Patient presents with   Carpal Tunnel    HPI Patient is in today for L hand pain.   Pt is L handed and she stated that she has been working 12-12 hour days since August. She informed me that she does a lot of repetitive work. No hand weakness.     She also reports that she hasn't been sleeping well she sleeps maybe 3-4 hours and will wake up and can't get back to sleep..   She has tried so melatonin over-the-counter but it does not seem to work great she says initially it helps but then when she wakes up it seems like a rebound phenomenon she is very awake.  She has not tried any Benadryl she does go to bed at the same time and get up at the same time.  She feels like the bedroom is nice and dark and cool and quiet.bothering her more at night for the first 4 months.  Has been using what sounds more like a sleeve compression for that wrist at night.  ROS      Objective:    BP 128/68   Pulse 91   Ht 5' 1 (1.549 m)   Wt 146 lb (66.2 kg)   SpO2 97%   BMI 27.59 kg/m    Physical Exam Vitals reviewed.  Constitutional:      Appearance: Normal appearance.  HENT:     Head: Normocephalic.  Pulmonary:     Effort: Pulmonary effort is normal.  Musculoskeletal:     Comments: Left wrist with normal range of motion.  Positive Tennille sign.  Positive Phalen's.  Nontender over the elbow.  Neurological:     Mental Status: She is alert and oriented to person, place, and time.  Psychiatric:        Mood and Affect: Mood normal.        Behavior: Behavior normal.     No results found for any visits on 08/14/23.      Assessment & Plan:   Problem List Items Addressed This Visit       Other   Primary insomnia   Discussed options for sleep.  She has a good sleep routine and hygiene thus far.  Will do a trial of trazodone .  Monitor for excess  sedation we will see if it is helpful for sleep maintenance.  If not then we could consider a trial of Ambien or Lunesta.      Relevant Medications   traZODone  (DESYREL ) 50 MG tablet   Other Visit Diagnoses       Left carpal tunnel syndrome    -  Primary   Relevant Medications   traZODone  (DESYREL ) 50 MG tablet      Carpal tunnel syndrome-fitted with left cock up splint.  Recommend wearing at night.  If not improving over the next 3 to 4 weeks please let us  know and can consider referral to Dr. ONEIDA for possible injection she is also planning on retiring in February which to make a huge difference in her continuing to have repetitive motions with her wrist that are likely aggravating her symptoms.  Meds ordered this encounter  Medications   traZODone  (DESYREL ) 50 MG tablet    Sig: Take 0.5-1 tablets (25-50 mg total) by mouth at bedtime as needed for sleep.  Dispense:  30 tablet    Refill:  3    Return in about 6 weeks (around 09/25/2023) for New start medication/Sleep .  Dorothyann Byars, MD

## 2023-08-14 NOTE — Patient Instructions (Signed)
 If your hand and wrist symptoms are not improving over the next 4 to 6 weeks lets get you scheduled with Dr. Benjamin Stain our sports med doc to discuss next steps.

## 2023-08-14 NOTE — Telephone Encounter (Signed)
 Copied from CRM [id number]. Topic: Clinical - Medication Refill >> Aug 14, 2023  4:42 PM Tonda B wrote: Most Recent Primary Care Visit:  Provider: ALVAN CRAVEN D  Department: [facility]  Visit Type: OFFICE VISIT  Date: 08/14/2023  Medication: traZODone  (DESYREL ) 50 MG tablet   Has the patient contacted their pharmacy? Yes (Agent: If no, request that the patient contact the pharmacy for the refill. If patient does not wish to contact the pharmacy document the reason why and proceed with request.) (Agent: If yes, when and what did the pharmacy advise?)  Is this the correct pharmacy for this prescription? Yes If no, delete pharmacy and type the correct one.  This is the patient's preferred pharmacy:    [facility] [place], [place] - [address] [place] [address] [place] [address] Phone: [phone] Fax: [phone]   Has the prescription been filled recently? Yes  Is the patient out of the medication? Yes  Has the patient been seen for an appointment in the last year OR does the patient have an upcoming appointment? No  Can we respond through MyChart? Yes  Agent: Please be advised that Rx refills may take up to 3 business days. We ask that you follow-up with your pharmacy.

## 2023-08-14 NOTE — Progress Notes (Signed)
 Pt is L handed and she stated that she has been working 12-12 hour days since August. She informed me that she does a lot of repetitive work.   She also reports that she hasn't been sleeping well she sleeps maybe 3-4 hours and will wake up and can't get back to sleep.Sara Callahan

## 2023-08-14 NOTE — Assessment & Plan Note (Signed)
 Discussed options for sleep.  She has a good sleep routine and hygiene thus far.  Will do a trial of trazodone.  Monitor for excess sedation we will see if it is helpful for sleep maintenance.  If not then we could consider a trial of Ambien or Lunesta.

## 2023-08-15 ENCOUNTER — Other Ambulatory Visit: Payer: Self-pay | Admitting: Family Medicine

## 2023-08-15 DIAGNOSIS — F5101 Primary insomnia: Secondary | ICD-10-CM

## 2023-08-16 NOTE — Telephone Encounter (Signed)
Ok to change to 90 day or leave as is since this is a new med start?

## 2023-08-16 NOTE — Telephone Encounter (Signed)
I agree, new start only for 30 days

## 2023-09-17 ENCOUNTER — Other Ambulatory Visit: Payer: Self-pay | Admitting: Family Medicine

## 2023-09-17 DIAGNOSIS — F325 Major depressive disorder, single episode, in full remission: Secondary | ICD-10-CM

## 2023-10-01 DIAGNOSIS — R928 Other abnormal and inconclusive findings on diagnostic imaging of breast: Secondary | ICD-10-CM | POA: Diagnosis not present

## 2023-10-01 DIAGNOSIS — N6011 Diffuse cystic mastopathy of right breast: Secondary | ICD-10-CM | POA: Diagnosis not present

## 2023-10-04 ENCOUNTER — Ambulatory Visit (INDEPENDENT_AMBULATORY_CARE_PROVIDER_SITE_OTHER): Payer: 59 | Admitting: Family Medicine

## 2023-10-04 ENCOUNTER — Encounter: Payer: Self-pay | Admitting: Family Medicine

## 2023-10-04 VITALS — BP 128/78 | HR 91 | Ht 61.0 in | Wt 150.0 lb

## 2023-10-04 DIAGNOSIS — F5101 Primary insomnia: Secondary | ICD-10-CM

## 2023-10-04 DIAGNOSIS — I10 Essential (primary) hypertension: Secondary | ICD-10-CM | POA: Diagnosis not present

## 2023-10-04 DIAGNOSIS — G245 Blepharospasm: Secondary | ICD-10-CM | POA: Diagnosis not present

## 2023-10-04 DIAGNOSIS — F325 Major depressive disorder, single episode, in full remission: Secondary | ICD-10-CM

## 2023-10-04 MED ORDER — TRAZODONE HCL 50 MG PO TABS: 25.0000 mg | ORAL_TABLET | Freq: Every evening | ORAL | 1 refills | Status: DC | PRN

## 2023-10-04 NOTE — Progress Notes (Signed)
 Established Patient Office Visit  Subjective  Patient ID: Sara Callahan, female    DOB: 07-Oct-1960  Age: 63 y.o. MRN: [id number]  Chief Complaint  Patient presents with   Insomnia   Eye Twitching    X 1 month     HPI  F/U new start trazodone for insomnia.  She says she is just been taking half a tab and that is been helping her sleep more consistently for at least 6 hours.  She said she has not tried a whole tab yet.  She actually just retired on Friday but it has been a crazy weekend she said her dryer broke.  And she has been trying to get it repaired.  Still having pain in her left wrist.    Right eye twitching on and off but lasting for hours at a time.  She says it has been going on for about a month and it has been really bothersome.  Occasionally it will affect the left upper light lid as well it is not painful.    ROS    Objective:     BP 128/78   Pulse 91   Ht 5\' 1"  (1.549 m)   Wt 150 lb (68 kg)   SpO2 99%   BMI 28.34 kg/m    Physical Exam Vitals reviewed.  Constitutional:      Appearance: Normal appearance.  HENT:     Head: Normocephalic.  Pulmonary:     Effort: Pulmonary effort is normal.  Neurological:     Mental Status: She is alert and oriented to person, place, and time.  Psychiatric:        Mood and Affect: Mood normal.        Behavior: Behavior normal.      No results found for any visits on 10/04/23.    The 10-year ASCVD risk score (Arnett DK, et al., 2019) is: 6.2%* (Cholesterol units were assumed)    Assessment & Plan:   Problem List Items Addressed This Visit       Cardiovascular and Mediastinum   HYPERTENSION, BENIGN ESSENTIAL   Pressure looks fantastic today continue current regimen.      Relevant Orders   CMP14+EGFR   Hemoglobin A1c     Other   Primary insomnia - Primary   Doing well with the half a tab of trazodone we will go ahead and send refill today she says she still might try taking a whole maybe on a day  where she does not have to do a lot now that she is retired she will let me know.  I did keep the prescription at have to hold.      Relevant Medications   traZODone (DESYREL) 50 MG tablet   Other Relevant Orders   CMP14+EGFR   Hemoglobin A1c   Depression, major, single episode, complete remission (HCC)   She feels like she is stable on her current dose of duloxetine.  Still having some anxiety symptoms but depressive symptoms are well-controlled.  Continue current regimen follow-up in 6 months.      Relevant Medications   traZODone (DESYREL) 50 MG tablet   Other Visit Diagnoses       Blepharospasm          Blepharospasm-gave reassurance that this typically resolves on its own sometimes it can come from straining the eyes.  Will check electrolytes just to see if that could be contributing.  Now that she is retired she is actually not on the computer  as much so she thought that that would help.  If it continues then recommend following up with her eye doctor.  Return in about 6 months (around 04/05/2024) for Hypertension.    Nani Gasser, MD

## 2023-10-04 NOTE — Assessment & Plan Note (Signed)
 Doing well with the half a tab of trazodone we will go ahead and send refill today she says she still might try taking a whole maybe on a day where she does not have to do a lot now that she is retired she will let me know.  I did keep the prescription at have to hold.

## 2023-10-04 NOTE — Assessment & Plan Note (Signed)
 She feels like she is stable on her current dose of duloxetine.  Still having some anxiety symptoms but depressive symptoms are well-controlled.  Continue current regimen follow-up in 6 months.

## 2023-10-04 NOTE — Assessment & Plan Note (Signed)
 Pressure looks fantastic today continue current regimen.

## 2023-10-05 ENCOUNTER — Encounter: Payer: Self-pay | Admitting: Family Medicine

## 2023-10-05 DIAGNOSIS — R7301 Impaired fasting glucose: Secondary | ICD-10-CM | POA: Insufficient documentation

## 2023-10-05 LAB — HEMOGLOBIN A1C
Est. average glucose Bld gHb Est-mCnc: 123 mg/dL
Hgb A1c MFr Bld: 5.9 % — ABNORMAL HIGH (ref 4.8–5.6)

## 2023-10-05 LAB — CMP14+EGFR
ALT: 37 IU/L — ABNORMAL HIGH (ref 0–32)
AST: 23 IU/L (ref 0–40)
Albumin: 4.6 g/dL (ref 3.9–4.9)
Alkaline Phosphatase: 71 IU/L (ref 44–121)
BUN/Creatinine Ratio: 33 — ABNORMAL HIGH (ref 12–28)
BUN: 20 mg/dL (ref 8–27)
Bilirubin Total: 0.7 mg/dL (ref 0.0–1.2)
CO2: 19 mmol/L — ABNORMAL LOW (ref 20–29)
Calcium: 9.8 mg/dL (ref 8.7–10.3)
Chloride: 104 mmol/L (ref 96–106)
Creatinine, Ser: 0.61 mg/dL (ref 0.57–1.00)
Globulin, Total: 2.4 g/dL (ref 1.5–4.5)
Glucose: 94 mg/dL (ref 70–99)
Potassium: 4.2 mmol/L (ref 3.5–5.2)
Sodium: 140 mmol/L (ref 134–144)
Total Protein: 7 g/dL (ref 6.0–8.5)
eGFR: 100 mL/min/{1.73_m2} (ref >59)

## 2023-10-05 NOTE — Progress Notes (Signed)
 Sara Callahan, your liver enzyme looks a litte better but still high. Your A1C is in the prediabetes range.  Work on cutting back on sugars and carb and increase your exercise and lets plan to recheck in 6 months.

## 2023-11-06 DIAGNOSIS — R569 Unspecified convulsions: Secondary | ICD-10-CM | POA: Diagnosis not present

## 2023-11-06 DIAGNOSIS — R55 Syncope and collapse: Secondary | ICD-10-CM | POA: Diagnosis not present

## 2023-11-06 DIAGNOSIS — E785 Hyperlipidemia, unspecified: Secondary | ICD-10-CM | POA: Diagnosis not present

## 2023-11-06 DIAGNOSIS — Z743 Need for continuous supervision: Secondary | ICD-10-CM | POA: Diagnosis not present

## 2023-11-06 DIAGNOSIS — I10 Essential (primary) hypertension: Secondary | ICD-10-CM | POA: Diagnosis not present

## 2023-11-06 DIAGNOSIS — R000 Tachycardia, unspecified: Secondary | ICD-10-CM | POA: Diagnosis not present

## 2023-11-07 DIAGNOSIS — E785 Hyperlipidemia, unspecified: Secondary | ICD-10-CM | POA: Diagnosis not present

## 2023-11-07 DIAGNOSIS — R569 Unspecified convulsions: Secondary | ICD-10-CM | POA: Diagnosis not present

## 2023-11-07 DIAGNOSIS — G40909 Epilepsy, unspecified, not intractable, without status epilepticus: Secondary | ICD-10-CM | POA: Diagnosis not present

## 2023-11-07 DIAGNOSIS — I10 Essential (primary) hypertension: Secondary | ICD-10-CM | POA: Diagnosis not present

## 2023-11-07 DIAGNOSIS — Z79890 Hormone replacement therapy (postmenopausal): Secondary | ICD-10-CM | POA: Diagnosis not present

## 2023-11-07 DIAGNOSIS — Z820 Family history of epilepsy and other diseases of the nervous system: Secondary | ICD-10-CM | POA: Diagnosis not present

## 2023-11-07 DIAGNOSIS — Q046 Congenital cerebral cysts: Secondary | ICD-10-CM | POA: Diagnosis not present

## 2023-11-07 DIAGNOSIS — Z87891 Personal history of nicotine dependence: Secondary | ICD-10-CM | POA: Diagnosis not present

## 2023-11-07 DIAGNOSIS — R55 Syncope and collapse: Secondary | ICD-10-CM | POA: Diagnosis not present

## 2023-11-07 DIAGNOSIS — G4089 Other seizures: Secondary | ICD-10-CM | POA: Diagnosis not present

## 2023-11-13 ENCOUNTER — Inpatient Hospital Stay: Admitting: Family Medicine

## 2023-12-21 DIAGNOSIS — N951 Menopausal and female climacteric states: Secondary | ICD-10-CM | POA: Diagnosis not present

## 2023-12-21 DIAGNOSIS — M2550 Pain in unspecified joint: Secondary | ICD-10-CM | POA: Diagnosis not present

## 2023-12-21 DIAGNOSIS — R6882 Decreased libido: Secondary | ICD-10-CM | POA: Diagnosis not present

## 2023-12-21 DIAGNOSIS — Z6827 Body mass index (BMI) 27.0-27.9, adult: Secondary | ICD-10-CM | POA: Diagnosis not present

## 2024-01-11 DIAGNOSIS — H5203 Hypermetropia, bilateral: Secondary | ICD-10-CM | POA: Diagnosis not present

## 2024-01-11 DIAGNOSIS — Z135 Encounter for screening for eye and ear disorders: Secondary | ICD-10-CM | POA: Diagnosis not present

## 2024-01-11 DIAGNOSIS — H524 Presbyopia: Secondary | ICD-10-CM | POA: Diagnosis not present

## 2024-01-11 DIAGNOSIS — H52223 Regular astigmatism, bilateral: Secondary | ICD-10-CM | POA: Diagnosis not present

## 2024-02-22 ENCOUNTER — Other Ambulatory Visit: Payer: Self-pay | Admitting: Family Medicine

## 2024-02-22 DIAGNOSIS — E7849 Other hyperlipidemia: Secondary | ICD-10-CM

## 2024-02-24 ENCOUNTER — Other Ambulatory Visit: Payer: Self-pay | Admitting: Family Medicine

## 2024-02-24 DIAGNOSIS — F5101 Primary insomnia: Secondary | ICD-10-CM

## 2024-03-18 DIAGNOSIS — L821 Other seborrheic keratosis: Secondary | ICD-10-CM | POA: Diagnosis not present

## 2024-03-18 DIAGNOSIS — L720 Epidermal cyst: Secondary | ICD-10-CM | POA: Diagnosis not present

## 2024-03-18 DIAGNOSIS — D1801 Hemangioma of skin and subcutaneous tissue: Secondary | ICD-10-CM | POA: Diagnosis not present

## 2024-03-18 DIAGNOSIS — L409 Psoriasis, unspecified: Secondary | ICD-10-CM | POA: Diagnosis not present

## 2024-03-18 DIAGNOSIS — D229 Melanocytic nevi, unspecified: Secondary | ICD-10-CM | POA: Diagnosis not present

## 2024-03-19 DIAGNOSIS — R232 Flushing: Secondary | ICD-10-CM | POA: Diagnosis not present

## 2024-03-19 DIAGNOSIS — G4709 Other insomnia: Secondary | ICD-10-CM | POA: Diagnosis not present

## 2024-03-19 DIAGNOSIS — N951 Menopausal and female climacteric states: Secondary | ICD-10-CM | POA: Diagnosis not present

## 2024-03-19 DIAGNOSIS — Z6826 Body mass index (BMI) 26.0-26.9, adult: Secondary | ICD-10-CM | POA: Diagnosis not present

## 2024-03-19 DIAGNOSIS — Z79890 Hormone replacement therapy (postmenopausal): Secondary | ICD-10-CM | POA: Diagnosis not present

## 2024-04-03 ENCOUNTER — Encounter: Payer: Self-pay | Admitting: Family Medicine

## 2024-04-03 ENCOUNTER — Ambulatory Visit (INDEPENDENT_AMBULATORY_CARE_PROVIDER_SITE_OTHER): Admitting: Family Medicine

## 2024-04-03 VITALS — BP 119/73 | HR 86 | Ht 61.0 in | Wt 141.1 lb

## 2024-04-03 DIAGNOSIS — L409 Psoriasis, unspecified: Secondary | ICD-10-CM | POA: Diagnosis not present

## 2024-04-03 DIAGNOSIS — F5101 Primary insomnia: Secondary | ICD-10-CM

## 2024-04-03 DIAGNOSIS — E7849 Other hyperlipidemia: Secondary | ICD-10-CM

## 2024-04-03 DIAGNOSIS — I10 Essential (primary) hypertension: Secondary | ICD-10-CM | POA: Diagnosis not present

## 2024-04-03 DIAGNOSIS — F325 Major depressive disorder, single episode, in full remission: Secondary | ICD-10-CM

## 2024-04-03 DIAGNOSIS — R7301 Impaired fasting glucose: Secondary | ICD-10-CM

## 2024-04-03 NOTE — Progress Notes (Signed)
 Established Patient Office Visit  Subjective  Patient ID: Sara Callahan, female    DOB: Sep 05, 1960  Age: 63 y.o. MRN: [id number]  Chief Complaint  Patient presents with   Hypertension   Insomnia    HPI Discussed the use of AI scribe software for clinical note transcription with the patient, who gave verbal consent to proceed.  History of Present Illness Sara Callahan is a 63 year old female who presents for a follow-up visit.  Sleep disturbance - Severe insomnia, described as 'horrible' - Out of trazodone  for over a month due to pharmacy issue - Trazodone  typically provides partial benefit for sleep  Mood and antidepressant therapy - Currently taking Cymbalta  (duloxetine ) - Mood is stable and Cymbalta  is effective  Psoriasis - Improvement in symptoms since retirement, attributed to reduced stress - Topical creams available for flare-ups, which are more frequent in summer  Cardiorespiratory status and physical activity - Physically active, exercising four to five days per week - No recent chest pain or shortness of breath - Blood pressure remains stable  Preventive care and health maintenance - Plans to receive influenza vaccination in October  Planned travel and caregiving responsibilities - Plans to travel out of state to assist son recovering from ACL surgery - Anticipates this may affect scheduling of bone density testing     ROS    Objective:     BP 119/73   Pulse 86   Ht 5' 1 (1.549 m)   Wt 141 lb 1.6 oz (64 kg)   SpO2 98%   BMI 26.66 kg/m    Physical Exam Vitals and nursing note reviewed.  Constitutional:      Appearance: Normal appearance.  HENT:     Head: Normocephalic and atraumatic.  Eyes:     Conjunctiva/sclera: Conjunctivae normal.  Cardiovascular:     Rate and Rhythm: Normal rate and regular rhythm.  Pulmonary:     Effort: Pulmonary effort is normal.     Breath sounds: Normal breath sounds.  Skin:    General: Skin is warm  and dry.  Neurological:     Mental Status: She is alert.  Psychiatric:        Mood and Affect: Mood normal.      No results found for any visits on 04/03/24.    The 10-year ASCVD risk score (Arnett DK, et al., 2019) is: 6%* (Cholesterol units were assumed)    Assessment & Plan:   Problem List Items Addressed This Visit       Cardiovascular and Mediastinum   HYPERTENSION, BENIGN ESSENTIAL - Primary   Relevant Orders   CBC   CMP14+EGFR   Lipid panel   Hemoglobin A1c     Endocrine   IFG (impaired fasting glucose)   Due for A1C check       Relevant Orders   CBC   CMP14+EGFR   Lipid panel   Hemoglobin A1c     Musculoskeletal and Integument   Psoriasis   Happy to refill creams PRN. Sxs well controlled right now.         Other   Primary insomnia   Will check on RF for trazodone .        Hyperlipidemia   Due to recheck.        Relevant Orders   CBC   CMP14+EGFR   Lipid panel   Hemoglobin A1c   Depression, major, single episode, complete remission (HCC)   Doing well on current regimen. No changes today.  Assessment and Plan Assessment & Plan Essential hypertension Blood pressures well-controlled. Due for labs.   Insomnia Chronic insomnia managed with trazodone . Poor sleep due to lack of medication. - Refill trazodone  prescription.  Depression Depression managed with duloxetine . Satisfied with current regimen. - Continue current duloxetine  regimen.   Let me know when ready to schedule bone density.    Return in about 6 months (around 10/01/2024).    Dorothyann Byars, MD

## 2024-04-03 NOTE — Progress Notes (Signed)
 Pt advised to schedule AWV. She declined FLU and PNE she would like to wait until next month to do these. Also advised that she is due for bone density.

## 2024-04-03 NOTE — Assessment & Plan Note (Signed)
Due for A1C check 

## 2024-04-03 NOTE — Assessment & Plan Note (Signed)
 Due to recheck

## 2024-04-03 NOTE — Assessment & Plan Note (Signed)
 Will check on RF for trazodone .

## 2024-04-03 NOTE — Assessment & Plan Note (Signed)
 Happy to refill creams PRN. Sxs well controlled right now.

## 2024-04-03 NOTE — Assessment & Plan Note (Signed)
Doing well on current regimen. No changes today. 

## 2024-04-07 ENCOUNTER — Ambulatory Visit: Admitting: Family Medicine

## 2024-05-15 ENCOUNTER — Other Ambulatory Visit: Payer: Self-pay | Admitting: Family Medicine

## 2024-05-15 DIAGNOSIS — I10 Essential (primary) hypertension: Secondary | ICD-10-CM

## 2024-06-11 ENCOUNTER — Other Ambulatory Visit: Payer: Self-pay | Admitting: *Deleted

## 2024-06-11 DIAGNOSIS — J0100 Acute maxillary sinusitis, unspecified: Secondary | ICD-10-CM

## 2024-06-11 MED ORDER — FLUTICASONE PROPIONATE 50 MCG/ACT NA SUSP: NASAL | 1 refills | Status: AC

## 2024-06-18 DIAGNOSIS — N951 Menopausal and female climacteric states: Secondary | ICD-10-CM | POA: Diagnosis not present

## 2024-06-18 DIAGNOSIS — Z79890 Hormone replacement therapy (postmenopausal): Secondary | ICD-10-CM | POA: Diagnosis not present

## 2024-06-18 DIAGNOSIS — Z6826 Body mass index (BMI) 26.0-26.9, adult: Secondary | ICD-10-CM | POA: Diagnosis not present

## 2024-06-18 DIAGNOSIS — M2550 Pain in unspecified joint: Secondary | ICD-10-CM | POA: Diagnosis not present

## 2024-06-18 DIAGNOSIS — M25561 Pain in right knee: Secondary | ICD-10-CM | POA: Diagnosis not present

## 2024-07-04 ENCOUNTER — Telehealth: Payer: Self-pay | Admitting: *Deleted

## 2024-07-04 NOTE — Telephone Encounter (Signed)
 Pt advised of recall and reports that she had spoken with the pharmacy about this.

## 2024-07-16 ENCOUNTER — Encounter: Payer: Self-pay | Admitting: Family Medicine

## 2024-07-16 ENCOUNTER — Ambulatory Visit: Admitting: Family Medicine

## 2024-07-16 VITALS — BP 132/78 | HR 83 | Temp 98.2°F | Ht 61.0 in | Wt 146.0 lb

## 2024-07-16 DIAGNOSIS — H65191 Other acute nonsuppurative otitis media, right ear: Secondary | ICD-10-CM

## 2024-07-16 DIAGNOSIS — J069 Acute upper respiratory infection, unspecified: Secondary | ICD-10-CM | POA: Diagnosis not present

## 2024-07-16 MED ORDER — PREDNISONE 20 MG PO TABS: 40.0000 mg | ORAL_TABLET | Freq: Every day | ORAL | 0 refills | Status: DC

## 2024-07-16 NOTE — Progress Notes (Signed)
 Acute Office Visit  Patient ID: Sara Callahan, female    DOB: 1960-12-12, 63 y.o.   MRN: [id number]  PCP: Alvan Dorothyann BIRCH, MD  Chief Complaint  Patient presents with   Cough    Patient c/o cough, nasal drainage and congestion, watery eyes and ears feel congested x Dec 5th, 2025.  Has used Theraflu and Mucinex OTC - Mucinex does not seem to be breaking up the mucus for patient. Patient has not taken any medication at all last week and has just restarted these on Monday 07/14/2024.  Patient stats was having abdominal cramping but was found to have allergy to Boron liquid recommended by [facility]     Subjective:     HPI  Discussed the use of AI scribe software for clinical note transcription with the patient, who gave verbal consent to proceed.  History of Present Illness Sara Callahan is a 63 year old female who presents with ear fullness and cough.  Ear fullness - Onset around December 12th - Bilateral ear fullness, right ear worse than left - Described as feeling 'down in the barrel' - Both ears feel stopped up - No associated sinus pressure  Cough - Persistent cough since symptom onset - Previously required head of bed elevation to sleep due to cough, now improved - No associated throat discomfort - No fever during illness  Gastrointestinal symptoms - Onset around December 5th after taking a liquid supplement - Severe stomach cramps and sensation of 'glass running through intestines' - Symptoms improved after discontinuing the supplement  Medication and symptomatic management - Using Mucinex (day and nighttime formulations), has gone through several bottles since onset - Took Lysine at 5:30 AM on day of visit - Used Flonase  daily until onset of illness   ROS     Objective:    BP 132/78   Pulse 83   Temp 98.2 F (36.8 C)   Ht 5' 1 (1.549 m)   Wt 146 lb (66.2 kg)   SpO2 98%   BMI 27.59 kg/m    Physical Exam Constitutional:      Appearance:  Normal appearance.  HENT:     Head: Normocephalic and atraumatic.     Right Ear: Ear canal and external ear normal. There is no impacted cerumen.     Left Ear: Tympanic membrane, ear canal and external ear normal. There is no impacted cerumen.     Ears:     Comments: Right TM pink at the base with effusion and shifted light reflex  Left TM distended with spread light reflex.     Nose: Nose normal.     Mouth/Throat:     Pharynx: Oropharynx is clear.  Eyes:     Conjunctiva/sclera: Conjunctivae normal.  Cardiovascular:     Rate and Rhythm: Normal rate and regular rhythm.  Pulmonary:     Effort: Pulmonary effort is normal.     Breath sounds: Normal breath sounds.  Musculoskeletal:     Cervical back: Neck supple. No tenderness.  Lymphadenopathy:     Cervical: No cervical adenopathy.  Skin:    General: Skin is warm and dry.  Neurological:     Mental Status: She is alert and oriented to person, place, and time.  Psychiatric:        Mood and Affect: Mood normal.       No results found for any visits on 07/16/24.     Assessment & Plan:   Problem List Items Addressed This Visit  None Visit Diagnoses       Acute effusion of right ear    -  Primary     Viral upper respiratory tract infection           Assessment and Plan Assessment & Plan Serous otitis media Fluid in right ear, irritation and pressure in left ear, likely post-upper respiratory infection. No infection signs. Symptoms suggest healing phase. - Prescribed oral prednisone  for 5 days to reduce inflammation and facilitate fluid drainage. - Restart Flonase  or Nasonex for 5-6 days to reduce ear pressure. - Monitor ear symptoms; report if no improvement by Monday or if symptoms worsen. - Discussed prednisone  side effects: stomach irritation, increased hunger, sleep disturbances.  Acute upper respiratory infection Symptoms began December 5th with cough and ear fullness, improving, suggesting healing. Likely  rhinovirus or bocavirus, flu less likely due to no fever. - Continue Mucinex nighttime as needed for cough relief. - Maintain hand hygiene and caution to prevent spread, not currently contagious. - Monitor for worsening symptoms or new fever; report if these occur.    Meds ordered this encounter  Medications   predniSONE  (DELTASONE ) 20 MG tablet    Sig: Take 2 tablets (40 mg total) by mouth daily with breakfast.    Dispense:  10 tablet    Refill:  0    Return if symptoms worsen or fail to improve.  Dorothyann Byars, MD [facility]

## 2024-07-21 ENCOUNTER — Encounter: Payer: Self-pay | Admitting: Family Medicine

## 2024-07-21 MED ORDER — PREDNISONE 20 MG PO TABS: ORAL_TABLET | ORAL | 0 refills | Status: AC

## 2024-09-16 ENCOUNTER — Encounter: Admitting: Family Medicine

## 2024-09-30 ENCOUNTER — Ambulatory Visit

## 2024-10-06 ENCOUNTER — Ambulatory Visit: Admitting: Family Medicine
